# Patient Record
Sex: Female | Born: 1975 | Race: White | Hispanic: No | Marital: Single | Smoking: Never smoker
Health system: Southern US, Community
[De-identification: ages and names within clinical notes are randomized; demographics above are authoritative.]

## PROBLEM LIST (undated history)

## (undated) DIAGNOSIS — E079 Disorder of thyroid, unspecified: Secondary | ICD-10-CM

## (undated) DIAGNOSIS — T7840XA Allergy, unspecified, initial encounter: Secondary | ICD-10-CM

## (undated) DIAGNOSIS — M7989 Other specified soft tissue disorders: Secondary | ICD-10-CM

## (undated) DIAGNOSIS — K829 Disease of gallbladder, unspecified: Secondary | ICD-10-CM

## (undated) DIAGNOSIS — E559 Vitamin D deficiency, unspecified: Secondary | ICD-10-CM

## (undated) DIAGNOSIS — F419 Anxiety disorder, unspecified: Secondary | ICD-10-CM

## (undated) DIAGNOSIS — M549 Dorsalgia, unspecified: Secondary | ICD-10-CM

## (undated) DIAGNOSIS — F32A Depression, unspecified: Secondary | ICD-10-CM

## (undated) DIAGNOSIS — I10 Essential (primary) hypertension: Secondary | ICD-10-CM

## (undated) HISTORY — DX: Anxiety disorder, unspecified: F41.9

## (undated) HISTORY — DX: Disease of gallbladder, unspecified: K82.9

## (undated) HISTORY — PX: CHOLECYSTECTOMY: SHX55

## (undated) HISTORY — DX: Allergy, unspecified, initial encounter: T78.40XA

## (undated) HISTORY — DX: Vitamin D deficiency, unspecified: E55.9

## (undated) HISTORY — DX: Depression, unspecified: F32.A

## (undated) HISTORY — DX: Other specified soft tissue disorders: M79.89

## (undated) HISTORY — PX: TONSILLECTOMY: SUR1361

## (undated) HISTORY — PX: WISDOM TOOTH EXTRACTION: SHX21

## (undated) HISTORY — DX: Disorder of thyroid, unspecified: E07.9

## (undated) HISTORY — DX: Essential (primary) hypertension: I10

## (undated) HISTORY — DX: Dorsalgia, unspecified: M54.9

---

## 2017-05-21 ENCOUNTER — Encounter: Payer: Self-pay | Admitting: Physician Assistant

## 2017-05-21 ENCOUNTER — Ambulatory Visit (INDEPENDENT_AMBULATORY_CARE_PROVIDER_SITE_OTHER): Payer: BC Managed Care – PPO | Admitting: Physician Assistant

## 2017-05-21 VITALS — BP 132/74 | HR 69 | Temp 98.2°F | Ht 65.75 in | Wt 259.1 lb

## 2017-05-21 DIAGNOSIS — I10 Essential (primary) hypertension: Secondary | ICD-10-CM | POA: Insufficient documentation

## 2017-05-21 DIAGNOSIS — E079 Disorder of thyroid, unspecified: Secondary | ICD-10-CM

## 2017-05-21 DIAGNOSIS — Z7689 Persons encountering health services in other specified circumstances: Secondary | ICD-10-CM

## 2017-05-21 NOTE — Assessment & Plan Note (Signed)
Currently stable on 200 mcg levothyroxine. Will re-check labs in 1 month at physical.

## 2017-05-21 NOTE — Progress Notes (Signed)
Carolyn Harvey is a 41 y.o. female here to establish care.  History of Present Illness:   Chief Complaint  Patient presents with  . Establish Care    Acute Concerns: None  Chronic Issues: Hypothyroidism -- dx in high school, levels last checked about 6 months ago, has been pretty steady and been on 200 mcg for a bit; did see an endocrinologist for diagnosis (several years ago) but not since then, strong family hx of thyroid disease Hypertension -- dx within the last couple of years, has been on Cozaar 50 mg for a while (was on different medications prior to settling Cozaar), tolerating well  BP Readings from Last 3 Encounters:  05/21/17 132/74    Health Maintenance: Immunizations -- requesting records, has already had flu shot, given at work Colonoscopy -- no early colonoscopy Mammogram -- no early breast ultrasound PAP -- due soon, no abnormal PAP smears, last done on 06/12/16 Weight -- Weight: 259 lb 2 oz (117.5 kg)   No flowsheet data found.  No flowsheet data found.   Other providers/specialists: Eye -- [facility] Dentist -- Lucille Passy     Past Medical History:  Diagnosis Date  . Allergy   . Hypertension   . Thyroid disease      Social History   Social History  . Marital status: Significant Other    Spouse name: N/A  . Number of children: N/A  . Years of education: N/A   Occupational History  . Not on file.   Social History Main Topics  . Smoking status: Never Smoker  . Smokeless tobacco: Never Used  . Alcohol use Yes  . Drug use: No  . Sexual activity: Yes   Other Topics Concern  . Not on file   Social History Narrative   [employer]   Single with same partner   No children, 2 dogs    Past Surgical History:  Procedure Laterality Date  . CHOLECYSTECTOMY    . TONSILLECTOMY    . WISDOM TOOTH EXTRACTION      Family History  Problem Relation Age of Onset  . Asthma Mother   . Miscarriages /  [place] Mother   . Diabetes Father   . Hypertension Father   . Miscarriages / Stillbirths Sister   . Alzheimer's disease Paternal Grandmother   . Stroke Paternal Grandfather   . Breast cancer Neg Hx   . Colon cancer Neg Hx     No Known Allergies   Current Medications:   Current Outpatient Prescriptions:  .  levonorgestrel (MIRENA) 20 MCG/24HR IUD, 1 each by Intrauterine route once., Disp: , Rfl:  .  cetirizine (ZYRTEC) 10 MG tablet, Take 1 tablet by mouth daily., Disp: , Rfl:  .  levothyroxine (SYNTHROID, LEVOTHROID) 200 MCG tablet, Take 1 tablet by mouth daily., Disp: , Rfl:  .  losartan (COZAAR) 50 MG tablet, Take 1 tablet by mouth daily., Disp: , Rfl:    Review of Systems:   ROS  Negative unless otherwise specified in HPI.  Vitals:   Vitals:   05/21/17 0843  BP: 132/74  Pulse: 69  Temp: 98.2 F (36.8 C)  TempSrc: Oral  SpO2: 98%  Weight: 259 lb 2 oz (117.5 kg)  Height: 5' 5.75" (1.67 m)     Body mass index is 42.14 kg/m.  Physical Exam:   Physical Exam  Constitutional: She is oriented to person, place, and time. She appears well-developed and well-nourished.  HENT:  Head: Normocephalic and atraumatic.  Eyes: Conjunctivae and EOM are normal.  Neck: Normal range of motion. Neck supple.  Pulmonary/Chest: Effort normal.  Musculoskeletal: Normal range of motion.  Neurological: She is alert and oriented to person, place, and time.  Skin: Skin is warm and dry.  Psychiatric: She has a normal mood and affect. Her behavior is normal. Judgment and thought content normal.    Assessment and Plan:    Problem List Items Addressed This Visit      Cardiovascular and Mediastinum   Hypertension    Currently on Cozaar 50 mg, stable. Will check labs in 1 month at physical.      Relevant Medications   losartan (COZAAR) 50 MG tablet     Endocrine   Thyroid disease    Currently stable on 200 mcg levothyroxine. Will re-check labs in 1 month at physical.       Relevant Medications   levothyroxine (SYNTHROID, LEVOTHROID) 200 MCG tablet    Other Visit Diagnoses    Encounter to establish care    -  Primary     . Reviewed expectations re: course of current medical issues. . Discussed self-management of symptoms. . Outlined signs and symptoms indicating need for more acute intervention. . Patient verbalized understanding and all questions were answered. . See orders for this visit as documented in the electronic medical record. . Patient received an After-Visit Summary.   CMA or LPN served as scribe during this visit. History, Physical, and Plan performed by medical provider. Documentation and orders reviewed and attested to.   Inda Coke, PA-C

## 2017-05-21 NOTE — Assessment & Plan Note (Signed)
Currently on Cozaar 50 mg, stable. Will check labs in 1 month at physical.

## 2017-05-21 NOTE — Patient Instructions (Signed)
It was great to meet you!  Let's plan for a physical sometime after Nov 10th.

## 2017-06-17 ENCOUNTER — Encounter: Payer: BC Managed Care – PPO | Admitting: Physician Assistant

## 2017-06-18 ENCOUNTER — Telehealth: Payer: BC Managed Care – PPO | Admitting: Nurse Practitioner

## 2017-06-18 DIAGNOSIS — H9209 Otalgia, unspecified ear: Secondary | ICD-10-CM

## 2017-06-18 MED ORDER — FLUTICASONE PROPIONATE 50 MCG/ACT NA SUSP: 2.0000 | Freq: Every day | NASAL | 6 refills | Status: AC

## 2017-06-18 NOTE — Progress Notes (Signed)
We are sorry that you are not feeling well.  Here is how we plan to help!  Based on what you have shared with me it looks like you have sinusitis.  Sinusitis is inflammation and infection in the sinus cavities of the head.  Based on your presentation I believe you most likely have Acute Viral Sinusitis.This is an infection most likely caused by a virus. There is not specific treatment for viral sinusitis other than to help you with the symptoms until the infection runs its course.  You may use an oral decongestant such as Mucinex D or if you have glaucoma or high blood pressure use plain Mucinex. Saline nasal spray help and can safely be used as often as needed for congestion, I have prescribed: Fluticasone nasal spray two sprays in each nostril twice a day . This should also help open your ear up.  Providers prescribe antibiotics to treat infections caused by bacteria. Antibiotics are very powerful in treating bacterial infections when they are used properly. To maintain their effectiveness, they should be used only when necessary. Overuse of antibiotics has resulted in the development of superbugs that are resistant to treatment!    After careful review of your answers, I would not recommend an antibiotic for your condition.  Antibiotics are not effective against viruses and therefore should not be used to treat them. Common examples of infections caused by viruses include colds and flu    Some authorities believe that zinc sprays or the use of Echinacea may shorten the course of your symptoms.  Sinus infections are not as easily transmitted as other respiratory infection, however we still recommend that you avoid close contact with loved ones, especially the very young and elderly.  Remember to wash your hands thoroughly throughout the day as this is the number one way to prevent the spread of infection!  Home Care:  Only take medications as instructed by your medical team.  Complete the entire  course of an antibiotic.  Do not take these medications with alcohol.  A steam or ultrasonic humidifier can help congestion.  You can place a towel over your head and breathe in the steam from hot water coming from a faucet.  Avoid close contacts especially the very young and the elderly.  Cover your mouth when you cough or sneeze.  Always remember to wash your hands.  Get Help Right Away If:  You develop worsening fever or sinus pain.  You develop a severe head ache or visual changes.  Your symptoms persist after you have completed your treatment plan.  Make sure you  Understand these instructions.  Will watch your condition.  Will get help right away if you are not doing well or get worse.  Your e-visit answers were reviewed by a board certified advanced clinical practitioner to complete your personal care plan.  Depending on the condition, your plan could have included both over the counter or prescription medications.  If there is a problem please reply  once you have received a response from your provider.  Your safety is important to [place].  If you have drug allergies check your prescription carefully.    You can use MyChart to ask questions about today's visit, request a non-urgent call back, or ask for a work or school excuse for 24 hours related to this e-Visit. If it has been greater than 24 hours you will need to follow up with your provider, or enter a new e-Visit to address those concerns.  You will get an e-mail in the next two days asking about your experience.  I hope that your e-visit has been valuable and will speed your recovery. Thank you for using e-visits.

## 2017-06-29 ENCOUNTER — Ambulatory Visit (INDEPENDENT_AMBULATORY_CARE_PROVIDER_SITE_OTHER): Payer: BC Managed Care – PPO | Admitting: Family Medicine

## 2017-06-29 VITALS — BP 132/84 | HR 105 | Temp 98.4°F | Ht 65.75 in | Wt 260.2 lb

## 2017-06-29 DIAGNOSIS — I10 Essential (primary) hypertension: Secondary | ICD-10-CM

## 2017-06-29 DIAGNOSIS — H9201 Otalgia, right ear: Secondary | ICD-10-CM | POA: Diagnosis not present

## 2017-06-29 DIAGNOSIS — Z0000 Encounter for general adult medical examination without abnormal findings: Secondary | ICD-10-CM

## 2017-06-29 DIAGNOSIS — E039 Hypothyroidism, unspecified: Secondary | ICD-10-CM | POA: Diagnosis not present

## 2017-06-29 LAB — CBC WITH DIFFERENTIAL/PLATELET
Basophils Absolute: 0 10*3/uL (ref 0.0–0.1)
Basophils Relative: 0.5 % (ref 0.0–3.0)
Eosinophils Absolute: 0.3 10*3/uL (ref 0.0–0.7)
Eosinophils Relative: 2.9 % (ref 0.0–5.0)
HCT: 44.7 % (ref 36.0–46.0)
Hemoglobin: 14.8 g/dL (ref 12.0–15.0)
Lymphocytes Relative: 17.1 % (ref 12.0–46.0)
Lymphs Abs: 1.5 10*3/uL (ref 0.7–4.0)
MCHC: 33.1 g/dL (ref 30.0–36.0)
MCV: 93.5 fl (ref 78.0–100.0)
Monocytes Absolute: 0.9 10*3/uL (ref 0.1–1.0)
Monocytes Relative: 9.7 % (ref 3.0–12.0)
Neutro Abs: 6.3 10*3/uL (ref 1.4–7.7)
Neutrophils Relative %: 69.8 % (ref 43.0–77.0)
Platelets: 344 10*3/uL (ref 150.0–400.0)
RBC: 4.78 Mil/uL (ref 3.87–5.11)
RDW: 13.5 % (ref 11.5–15.5)
WBC: 9 10*3/uL (ref 4.0–10.5)

## 2017-06-29 LAB — COMPREHENSIVE METABOLIC PANEL
ALT: 17 U/L (ref 0–35)
AST: 15 U/L (ref 0–37)
Albumin: 3.9 g/dL (ref 3.5–5.2)
Alkaline Phosphatase: 56 U/L (ref 39–117)
BUN: 14 mg/dL (ref 6–23)
CO2: 30 mEq/L (ref 19–32)
Calcium: 9.3 mg/dL (ref 8.4–10.5)
Chloride: 102 mEq/L (ref 96–112)
Creatinine, Ser: 0.79 mg/dL (ref 0.40–1.20)
GFR: 84.85 mL/min (ref >60.00)
Glucose, Bld: 84 mg/dL (ref 70–99)
Potassium: 4.5 mEq/L (ref 3.5–5.1)
Sodium: 139 mEq/L (ref 135–145)
Total Bilirubin: 1.3 mg/dL — ABNORMAL HIGH (ref 0.2–1.2)
Total Protein: 6.5 g/dL (ref 6.0–8.3)

## 2017-06-29 LAB — HEMOGLOBIN A1C: Hgb A1c MFr Bld: 5.5 % (ref 4.6–6.5)

## 2017-06-29 LAB — T4, FREE: Free T4: 1.34 ng/dL (ref 0.60–1.60)

## 2017-06-29 LAB — TSH: TSH: 4.63 u[IU]/mL — ABNORMAL HIGH (ref 0.35–4.50)

## 2017-06-29 MED ORDER — MONTELUKAST SODIUM 10 MG PO TABS: 10.0000 mg | ORAL_TABLET | Freq: Every day | ORAL | 3 refills | Status: DC

## 2017-06-29 NOTE — Progress Notes (Signed)
Subjective:    Riko Lumsden is a 41 y.o. female and is here for a comprehensive physical exam.  Pertinent Gynecological History: No LMP recorded. Patient is not currently having periods (Reason: IUD).  Health Maintenance Due  Topic Date Due  . HIV Screening  07/19/1990  . TETANUS/TDAP  07/19/1994  . PAP SMEAR  07/19/1996   PMHx, SurgHx, SocialHx, Medications, and Allergies were reviewed in the Visit Navigator and updated as appropriate.   Past Medical History:  Diagnosis Date  . Allergy   . Hypertension   . Thyroid disease    Past Surgical History:  Procedure Laterality Date  . CHOLECYSTECTOMY    . TONSILLECTOMY    . WISDOM TOOTH EXTRACTION     Family History  Problem Relation Age of Onset  . Asthma Mother   . Miscarriages / [place] Mother   . Diabetes Father   . Hypertension Father   . Miscarriages / Stillbirths Sister   . Alzheimer's disease Paternal Grandmother   . Stroke Paternal Grandfather   . Breast cancer Neg Hx   . Colon cancer Neg Hx    Social History   Tobacco Use  . Smoking status: Never Smoker  . Smokeless tobacco: Never Used  Substance Use Topics  . Alcohol use: Yes  . Drug use: No   Review of Systems:   Pertinent items are noted in the HPI. Otherwise, ROS is negative.  Objective:   BP 132/84   Pulse (!) 105   Temp 98.4 F (36.9 C) (Oral)   Ht 5' 5.75" (1.67 m)   Wt 260 lb 3.2 oz (118 kg)   SpO2 96%   BMI 42.32 kg/m    Wt Readings from Last 3 Encounters:  06/29/17 260 lb 3.2 oz (118 kg)  05/21/17 259 lb 2 oz (117.5 kg)     Ht Readings from Last 3 Encounters:  06/29/17 5' 5.75" (1.67 m)  05/21/17 5' 5.75" (1.67 m)   General appearance: alert, cooperative and appears stated age. Head: normocephalic, without obvious abnormality, atraumatic. Neck: no adenopathy, supple, symmetrical, trachea midline; thyroid not enlarged, symmetric, no tenderness/mass/nodules. Lungs: clear to auscultation bilaterally. Heart: regular rate  and rhythm Abdomen: soft, non-tender; no masses,  no organomegaly. Extremities: extremities normal, atraumatic, no cyanosis or edema. Skin: skin color, texture, turgor normal, no rashes or lesions. Lymph: cervical, supraclavicular, and axillary nodes normal; no abnormal inguinal nodes palpated. Neurologic: grossly normal.  Assessment/Plan:   Diagnoses and all orders for this visit:  Routine physical examination -     CBC with Differential/Platelet -     Lipid panel; Future -     HIV antibody  Essential hypertension -     Comprehensive metabolic panel  Hypothyroidism, unspecified type -     T4, free -     TSH  Otalgia, right Comments: Ongoing. Concern for ETD. Continue flonase, zyrtec. Will add Singulair while sending to ENT.  Orders: -     montelukast (SINGULAIR) 10 MG tablet; Take 1 tablet (10 mg total) by mouth at bedtime. -     Ambulatory referral to ENT  Morbid obesity (Jud) -     Hemoglobin A1c    Patient Counseling:   [x]     Nutrition: Stressed importance of moderation in sodium/caffeine intake, saturated fat and cholesterol, caloric balance, sufficient intake of fresh fruits, vegetables, fiber, calcium, iron, and 1 mg of folate supplement per day (for females capable of pregnancy).   [x]      Stressed the importance of regular  exercise.    [x]     Substance Abuse: Discussed cessation/primary prevention of tobacco, alcohol, or other drug use; driving or other dangerous activities under the influence; availability of treatment for abuse.    [x]      Injury prevention: Discussed safety belts, safety helmets, smoke detector, smoking near bedding or upholstery.    [x]      Sexuality: Discussed sexually transmitted diseases, partner selection, use of condoms, avoidance of unintended pregnancy  and contraceptive alternatives.    [x]     Dental health: Discussed importance of regular tooth brushing, flossing, and dental visits.   [x]      Health maintenance and  immunizations reviewed. Please refer to Health maintenance section.   Briscoe Deutscher, DO [place]

## 2017-06-30 ENCOUNTER — Other Ambulatory Visit: Payer: Self-pay | Admitting: Physician Assistant

## 2017-06-30 ENCOUNTER — Encounter: Payer: Self-pay | Admitting: Family Medicine

## 2017-06-30 LAB — HIV ANTIBODY (ROUTINE TESTING W REFLEX): HIV 1&2 Ab, 4th Generation: NONREACTIVE

## 2017-06-30 MED ORDER — LOSARTAN POTASSIUM 50 MG PO TABS: 50.0000 mg | ORAL_TABLET | Freq: Every day | ORAL | 0 refills | Status: DC

## 2017-06-30 NOTE — Telephone Encounter (Signed)
Copied from [place] [id number]. Topic: Quick Communication - Rx Refill/Question >> Jun 30, 2017 12:49 PM Oliver Pila B wrote: Pt called to get her losartan refilled, pt was told to contact pharmacy when rx are needed

## 2017-07-05 ENCOUNTER — Telehealth: Payer: Self-pay | Admitting: Physician Assistant

## 2017-07-05 ENCOUNTER — Other Ambulatory Visit: Payer: Self-pay

## 2017-07-05 ENCOUNTER — Other Ambulatory Visit (INDEPENDENT_AMBULATORY_CARE_PROVIDER_SITE_OTHER): Payer: BC Managed Care – PPO

## 2017-07-05 DIAGNOSIS — E079 Disorder of thyroid, unspecified: Secondary | ICD-10-CM

## 2017-07-05 DIAGNOSIS — Z0000 Encounter for general adult medical examination without abnormal findings: Secondary | ICD-10-CM | POA: Diagnosis not present

## 2017-07-05 DIAGNOSIS — E039 Hypothyroidism, unspecified: Secondary | ICD-10-CM

## 2017-07-05 LAB — LIPID PANEL
Cholesterol: 145 mg/dL (ref 0–200)
HDL: 36.2 mg/dL — ABNORMAL LOW (ref >39.00)
LDL Cholesterol: 92 mg/dL (ref 0–99)
NonHDL: 108.6
Total CHOL/HDL Ratio: 4
Triglycerides: 85 mg/dL (ref 0.0–149.0)
VLDL: 17 mg/dL (ref 0.0–40.0)

## 2017-07-05 NOTE — Telephone Encounter (Signed)
Okay referral with G[facility]. She may not be available that soon. Dx hypothyroid - on high dose med.

## 2017-07-05 NOTE — Telephone Encounter (Signed)
Patient mentioned at check out a referral to Endocrinology. I did not see a referral in the system. Patient would prefer Carolyn Harvey if possible. Patient also mentioned getting in to see her prior to 08-11-17 if possible. Please advise.

## 2017-07-05 NOTE — Telephone Encounter (Signed)
Dr. Wallace, please see message and advise. 

## 2017-07-05 NOTE — Addendum Note (Signed)
Addended by: Marian Sorrow on: 07/05/2017 11:14 AM   Modules accepted: Orders

## 2017-07-05 NOTE — Telephone Encounter (Signed)
Spoke to pt, told her order for Referral for Endocrinology was done and someone will be contacting you to schedule an appt. Pt verbalized understanding.

## 2017-09-28 ENCOUNTER — Other Ambulatory Visit: Payer: Self-pay | Admitting: Physician Assistant

## 2017-09-28 MED ORDER — LOSARTAN POTASSIUM 50 MG PO TABS: 50.0000 mg | ORAL_TABLET | Freq: Every day | ORAL | 0 refills | Status: DC

## 2017-09-29 ENCOUNTER — Encounter: Payer: Self-pay | Admitting: Internal Medicine

## 2017-09-29 ENCOUNTER — Ambulatory Visit: Payer: BC Managed Care – PPO | Admitting: Internal Medicine

## 2017-09-29 VITALS — BP 124/76 | HR 78 | Ht 65.75 in | Wt 261.0 lb

## 2017-09-29 DIAGNOSIS — E039 Hypothyroidism, unspecified: Secondary | ICD-10-CM

## 2017-09-29 LAB — TSH: TSH: 7.05 u[IU]/mL — ABNORMAL HIGH (ref 0.35–4.50)

## 2017-09-29 LAB — T4, FREE: Free T4: 1.03 ng/dL (ref 0.60–1.60)

## 2017-09-29 NOTE — Patient Instructions (Signed)
Please continue levothyroxine 200 mcg daily.  Take the thyroid hormone every day, with water, at least 30 minutes before breakfast, separated by at least 4 hours from: - acid reflux medications - calcium - iron - multivitamins  Please come back for a follow-up appointment in 6 months.

## 2017-09-29 NOTE — Progress Notes (Signed)
Patient ID: Carolyn Harvey, female   DOB: 28-Feb-1976, 42 y.o.   MRN: [id number]    HPI  Carolyn Harvey is a 42 y.o.-year-old female, referred by her PCP, Inda Coke, PA , for management of hypothyroidism.  Pt. has been dx with hypothyroidism in early 1990s >> on Levothyroxine 200 mcg (last dose change 6-7 years). Prev. On Synthroid DAW.  She takes the thyroid hormone: - fasting - with water - separated by >30 min from b'fast  - no calcium, iron, PPIs - + multivitamins at night - no Biotin  I reviewed pt's thyroid tests: Lab Results  Component Value Date   TSH 4.63 (H) 06/29/2017   FREET4 1.34 06/29/2017    Pt denies: - weight gain - fatigue - cold intolerance - depression - constipation - dry skin - hair loss  Pt denies feeling nodules in neck, hoarseness, dysphagia/odynophagia, SOB with lying down.  She has + FH of thyroid disorders in: M - hyperthyroidism -  Had to have RAI tx; 2 sisters. + sister with RA. + FH of thyroid cancer in a first cousin.  No h/o radiation tx to head or neck. No recent use of iodine supplements. No recent steroids. No contrasted CT.  Pt. also has a history of HTN. On Losartan.   ROS: Constitutional: + see HPI Eyes: no blurry vision, no xerophthalmia ENT: no sore throat, no nodules palpated in throat, no dysphagia/odynophagia, no hoarseness Cardiovascular: no CP/SOB/palpitations/+ L leg swelling (chronic, prev. Investigated >> no pathology found) Respiratory: no cough/SOB Gastrointestinal: no N/V/D/C Musculoskeletal: no muscle/joint aches Skin: no rashes Neurological: no tremors/numbness/tingling/dizziness Psychiatric: no depression/anxiety  Past Medical History:  Diagnosis Date  . Allergy   . Hypertension   . Thyroid disease    Past Surgical History:  Procedure Laterality Date  . CHOLECYSTECTOMY    . TONSILLECTOMY    . WISDOM TOOTH EXTRACTION     Social History   Socioeconomic History  . Marital status: Single   Spouse name: Not on file  . Number of children: 0  Occupational History  . librarian  Tobacco Use  . Smoking status: Never Smoker  . Smokeless tobacco: Never Used  Substance and Sexual Activity  . Alcohol use: Yes, beer, 1-2x a week  . Drug use: No  . Sexual activity: Yes  Other Topics Concern  . Not on file  Social History Narrative   [employer]   Single with same partner   No children, 2 dogs   Current Outpatient Medications on File Prior to Visit  Medication Sig Dispense Refill  . cetirizine (ZYRTEC) 10 MG tablet Take 1 tablet by mouth daily.    . fluticasone (FLONASE) 50 MCG/ACT nasal spray Place 2 sprays daily into both nostrils. 16 g 6  . levonorgestrel (MIRENA) 20 MCG/24HR IUD 1 each by Intrauterine route once.    Marland Kitchen levothyroxine (SYNTHROID, LEVOTHROID) 200 MCG tablet Take 1 tablet by mouth daily.    Marland Kitchen losartan (COZAAR) 50 MG tablet Take 1 tablet (50 mg total) by mouth daily. 90 tablet 0  . montelukast (SINGULAIR) 10 MG tablet Take 1 tablet (10 mg total) by mouth at bedtime. (Patient not taking: Reported on 09/29/2017) 30 tablet 3   No current facility-administered medications on file prior to visit.    No Known Allergies Family History  Problem Relation Age of Onset  . Asthma Mother   . Miscarriages / [place] Mother   . Hypothyroidism Mother   . Diabetes Father   . Hypertension Father   .  Miscarriages / Stillbirths Sister   . Fibromyalgia Sister   . Hypothyroidism Sister   . CVA Maternal Grandfather   . Alzheimer's disease Paternal Grandmother   . Stroke Paternal Grandfather   . Breast cancer Neg Hx   . Colon cancer Neg Hx     PE: BP 124/76   Pulse 78   Ht 5' 5.75" (1.67 m)   Wt 261 lb (118.4 kg)   BMI 42.45 kg/m  Wt Readings from Last 3 Encounters:  09/29/17 261 lb (118.4 kg)  06/29/17 260 lb 3.2 oz (118 kg)  05/21/17 259 lb 2 oz (117.5 kg)   Constitutional: obese, in NAD Eyes: PERRLA, EOMI, no exophthalmos ENT: moist mucous  membranes, no thyromegaly, no cervical lymphadenopathy Cardiovascular: RRR, No MRG Respiratory: CTA B Gastrointestinal: abdomen soft, NT, ND, BS+ Musculoskeletal: no deformities, strength intact in all 4 Skin: moist, warm, no rashes Neurological: no tremor with outstretched hands, DTR normal in all 4  ASSESSMENT: 1. Hypothyroidism  PLAN:  1. Patient with long-standing hypothyroidism (dx'ed in high school), on high dose levothyroxine therapy. Last TSH from 3 mo ago was slightly high. Dose of LT4 was not changed then, in fact, it has not been changed in several years). - she appears euthyroid and has no complaints at this visit - she does not appear to have a goiter, thyroid nodules, or neck compression symptoms - We discussed about correct intake of levothyroxine, fasting, with water, separated by at least 30 minutes from breakfast, and separated by more than 4 hours from calcium, iron, multivitamins, acid reflux medications (PPIs). - will check thyroid tests today: TSH, free T4 and will also add TPO and ATA Abs to check for Hashimoto's thyroiditis - she does have an extensive FH of thyroid ds and also has an autoimmune ds (RA) in sister) - we discussed that Hashimoto's thyroiditis (in case she does have this) is an autoimmune condition, the most common cause of hypothyroidism and [place], and, while selenium can help lowering the antibodies, it does not work all patients.  Mostly, the treatment is limited to levothyroxine and lifestyle changes to  improve the immune system. - If labs today are abnormal, she will need to return in ~6 weeks for repeat labs - Otherwise, I will see her back in 6 months  Component     Latest Ref Rng & Units 09/29/2017  T4,Free(Direct)     0.60 - 1.60 ng/dL 1.03  TSH     0.35 - 4.50 uIU/mL 7.05 (H)  Thyroglobulin Ab     < or = 1 IU/mL 190 (H)  Thyroperoxidase Ab SerPl-aCnc     <9 IU/mL 6   New dx of Hashimoto's thyroiditis. TSH above target >> increase LT4 to  225 mcg daily and repeat TFTs in 6 weeks.  Philemon Kingdom, MD PhD [facility] Endocrinology

## 2017-09-30 ENCOUNTER — Other Ambulatory Visit: Payer: Self-pay | Admitting: Internal Medicine

## 2017-09-30 LAB — THYROGLOBULIN ANTIBODY: THYROGLOBULIN AB: 190 [IU]/mL — AB (ref <=1)

## 2017-09-30 LAB — THYROID PEROXIDASE ANTIBODY: THYROID PEROXIDASE ANTIBODY: 6 [IU]/mL (ref <9)

## 2017-09-30 MED ORDER — LEVOTHYROXINE SODIUM 25 MCG PO TABS: 25.0000 ug | ORAL_TABLET | Freq: Every day | ORAL | 5 refills | Status: DC

## 2018-03-10 ENCOUNTER — Other Ambulatory Visit: Payer: Self-pay

## 2018-03-10 MED ORDER — LEVOTHYROXINE SODIUM 200 MCG PO TABS: 200.0000 ug | ORAL_TABLET | Freq: Every day | ORAL | 1 refills | Status: DC

## 2018-03-10 MED ORDER — LEVOTHYROXINE SODIUM 25 MCG PO TABS: 25.0000 ug | ORAL_TABLET | Freq: Every day | ORAL | 1 refills | Status: DC

## 2018-03-29 ENCOUNTER — Other Ambulatory Visit: Payer: Self-pay | Admitting: Family Medicine

## 2018-03-29 MED ORDER — LOSARTAN POTASSIUM 50 MG PO TABS: 50.0000 mg | ORAL_TABLET | Freq: Every day | ORAL | 0 refills | Status: DC

## 2018-03-31 ENCOUNTER — Ambulatory Visit: Payer: BC Managed Care – PPO | Admitting: Internal Medicine

## 2018-06-17 ENCOUNTER — Encounter: Payer: Self-pay | Admitting: Internal Medicine

## 2018-06-17 ENCOUNTER — Ambulatory Visit: Payer: BC Managed Care – PPO | Admitting: Internal Medicine

## 2018-06-17 VITALS — BP 120/78 | HR 68 | Ht 65.75 in | Wt 265.0 lb

## 2018-06-17 DIAGNOSIS — E063 Autoimmune thyroiditis: Secondary | ICD-10-CM | POA: Diagnosis not present

## 2018-06-17 DIAGNOSIS — E039 Hypothyroidism, unspecified: Secondary | ICD-10-CM

## 2018-06-17 DIAGNOSIS — E038 Other specified hypothyroidism: Secondary | ICD-10-CM

## 2018-06-17 LAB — TSH: TSH: 0.93 u[IU]/mL (ref 0.35–4.50)

## 2018-06-17 LAB — T4, FREE: Free T4: 1.23 ng/dL (ref 0.60–1.60)

## 2018-06-17 NOTE — Progress Notes (Signed)
Patient ID: Carolyn Harvey, female   DOB: 1975-11-06, 42 y.o.   MRN: [id number]    HPI  Carolyn Harvey is a 42 y.o.-year-old female, returning for f/u for hypothyroidism. Last OV 09/2017.  Reviewed and addended hx: Pt. has been dx with hypothyroidism in early 1990s >> on Levothyroxine 200 mcg. Prev. On Synthroid DAW.  At last OV, we increased her LT4 dose to 225 mcg daily.  She did not return for repeat labs as she thought she needed an office visit for this.  Pt takes LT4: - in am - fasting - at least 30 min from b'fast - no Ca, Fe, PPIs - + MVIs at night - not on Biotin  Reviewed her TFTs: Lab Results  Component Value Date   TSH 7.05 (H) 09/29/2017   TSH 4.63 (H) 06/29/2017   FREET4 1.03 09/29/2017   FREET4 1.34 06/29/2017    Thyroid Ab's were elevated at last OV: Component     Latest Ref Rng & Units 09/29/2017  Thyroglobulin Ab     < or = 1 IU/mL 190 (H)  Thyroperoxidase Ab SerPl-aCnc     <9 IU/mL 6   Pt denies: - feeling nodules in neck - hoarseness - dysphagia - choking - SOB with lying down  She has + FH of thyroid disorders in: M - hyperthyroidism -  Had to have RAI tx; 2 sisters. + sister with RA. + FH of thyroid cancer in a first cousin.  No h/o radiation tx to head or neck. No herbal supplements. No Biotin use. No recent steroids use.   Pt. also has a history of HTN. On Losartan.   She started Singulair.   ROS: Constitutional: + weight gain/no weight loss, no fatigue, no subjective hyperthermia, no subjective hypothermia Eyes: no blurry vision, no xerophthalmia ENT: no sore throat, + see HPI Cardiovascular: no CP/no SOB/no palpitations/+L leg swelling (chronic) Respiratory: no cough/no SOB/no wheezing Gastrointestinal: no N/no V/no D/no C/no acid reflux Musculoskeletal: no muscle aches/no joint aches Skin: no rashes, no hair loss Neurological: no tremors/no numbness/no tingling/no dizziness  I reviewed pt's medications, allergies, PMH, social hx,  family hx, and changes were documented in the history of present illness. Otherwise, unchanged from my initial visit note.  Past Medical History:  Diagnosis Date  . Allergy   . Hypertension   . Thyroid disease    Past Surgical History:  Procedure Laterality Date  . CHOLECYSTECTOMY    . TONSILLECTOMY    . WISDOM TOOTH EXTRACTION     Social History   Socioeconomic History  . Marital status: Single    Spouse name: Not on file  . Number of children: 0  Occupational History  . librarian  Tobacco Use  . Smoking status: Never Smoker  . Smokeless tobacco: Never Used  Substance and Sexual Activity  . Alcohol use: Yes, beer, 1-2x a week  . Drug use: No  . Sexual activity: Yes  Other Topics Concern  . Not on file  Social History Narrative   [facility]   Single with same partner   No children, 2 dogs   Current Outpatient Medications on File Prior to Visit  Medication Sig Dispense Refill  . cetirizine (ZYRTEC) 10 MG tablet Take 1 tablet by mouth daily.    . fluticasone (FLONASE) 50 MCG/ACT nasal spray Place 2 sprays daily into both nostrils. 16 g 6  . levonorgestrel (MIRENA) 20 MCG/24HR IUD 1 each by Intrauterine route once.    Marland Kitchen levothyroxine (SYNTHROID, LEVOTHROID)  200 MCG tablet Take 1 tablet (200 mcg total) by mouth daily. 90 tablet 1  . levothyroxine (SYNTHROID, LEVOTHROID) 25 MCG tablet Take 1 tablet (25 mcg total) by mouth daily before breakfast. 90 tablet 1  . losartan (COZAAR) 50 MG tablet Take 1 tablet (50 mg total) by mouth daily. 90 tablet 0  . montelukast (SINGULAIR) 10 MG tablet Take 1 tablet (10 mg total) by mouth at bedtime. (Patient not taking: Reported on 09/29/2017) 30 tablet 3   No current facility-administered medications on file prior to visit.    No Known Allergies Family History  Problem Relation Age of Onset  . Asthma Mother   . Miscarriages / [place] Mother   . Hypothyroidism Mother   . Diabetes Father   . Hypertension Father   .  Miscarriages / Stillbirths Sister   . Fibromyalgia Sister   . Hypothyroidism Sister   . CVA Maternal Grandfather   . Alzheimer's disease Paternal Grandmother   . Stroke Paternal Grandfather   . Breast cancer Neg Hx   . Colon cancer Neg Hx     PE: BP 120/78   Pulse 68   Ht 5' 5.75" (1.67 m) Comment: measured  Wt 265 lb (120.2 kg)   SpO2 97%   BMI 43.10 kg/m  Wt Readings from Last 3 Encounters:  06/17/18 265 lb (120.2 kg)  09/29/17 261 lb (118.4 kg)  06/29/17 260 lb 3.2 oz (118 kg)   Constitutional: overweight, in NAD Eyes: PERRLA, EOMI, no exophthalmos ENT: moist mucous membranes, no thyromegaly, no cervical lymphadenopathy Cardiovascular: RRR, No MRG, + L leg swelling (chronic) Respiratory: CTA B Gastrointestinal: abdomen soft, NT, ND, BS+ Musculoskeletal: no deformities, strength intact in all 4 Skin: moist, warm, no rashes Neurological: no tremor with outstretched hands, DTR normal in all 4  ASSESSMENT: 1. Hypothyroidism  2. Hashimoto's thyroiditis  PLAN:  1. Patient with long-standing hypothyroidism diagnosed in high school, on high-dose levothyroxine therapy.  At last visit, TSH was still high, so increased her levothyroxine dose. - latest thyroid labs reviewed with pt >> high, but this was before the increase in levothyroxine dose.  She did not return for labs afterwards - she continues on LT4 225 mcg daily - pt feels good on this dose. - we discussed about taking the thyroid hormone every day, with water, >30 minutes before breakfast, separated by >4 hours from acid reflux medications, calcium, iron, multivitamins. Pt. is taking it correctly. - will check thyroid tests today: TSH and fT4 - If labs are abnormal, she will need to return for repeat TFTs in 1.5 months - Otherwise, I will see her back in 1 year  2. Hashimoto's thyroiditis - new dx - we discussed about her Hashimoto thyroiditis diagnosis. I explained that this is an autoimmune disorder, in which she  develops antibodies against her own thyroid. The antibodies bind to the thyroid tissue and cause inflammation, and, eventually, destruction of the gland and hypothyroidism.  I also explained that thyroid enlargement especially at the beginning of her Hashimoto thyroiditis course is not uncommon, and it has a waxing and waning character.  - We discussed about treatment for Hashimoto thyroiditis, which is actually limited to thyroid hormones in case her TFTs are abnormal. Supplements like selenium has been tried with various results, some showing improvement in the TPO antibodies. However, there are no randomized controlled trials of this are consistent results between trials. We also discussed about ways to improve her immune system (relaxation, diet, exercise, sleep) to reduce the  Ab titer and, subsequently, the thyroid inflammation. - We decided to try selenium 200 mcg daily and I also recommended to stop dairy products to reduce GI inflammation which is correlated to immunogenicity  Office Visit on 06/17/2018  Component Date Value Ref Range Status  . TSH 06/17/2018 0.93  0.35 - 4.50 uIU/mL Final  . Free T4 06/17/2018 1.23  0.60 - 1.60 ng/dL Final   Comment: Specimens from patients who are undergoing biotin therapy and /or ingesting biotin supplements may contain high levels of biotin.  The higher biotin concentration in these specimens interferes with this Free T4 assay.  Specimens that contain high levels  of biotin may cause false high results for this Free T4 assay.  Please interpret results in light of the total clinical presentation of the patient.     Thyroid tests are excellent.  We will continue the current dose of levothyroxine.  Philemon Kingdom, MD PhD [facility]

## 2018-06-17 NOTE — Patient Instructions (Signed)
Please stop at the lab.  Try selenium 200 mcg daily.  Please continue Levothyroxine 225 mcg daily.  Take the thyroid hormone every day, with water, at least 30 minutes before breakfast, separated by at least 4 hours from: - acid reflux medications - calcium - iron - multivitamins  Please come back for a follow-up appointment in 1 year.

## 2018-06-28 ENCOUNTER — Other Ambulatory Visit: Payer: Self-pay | Admitting: Family Medicine

## 2018-08-24 ENCOUNTER — Encounter: Payer: Self-pay | Admitting: Physician Assistant

## 2018-08-24 ENCOUNTER — Ambulatory Visit (INDEPENDENT_AMBULATORY_CARE_PROVIDER_SITE_OTHER): Payer: BC Managed Care – PPO | Admitting: Physician Assistant

## 2018-08-24 VITALS — BP 110/80 | HR 65 | Temp 98.6°F | Ht 65.75 in | Wt 262.4 lb

## 2018-08-24 DIAGNOSIS — Z0000 Encounter for general adult medical examination without abnormal findings: Secondary | ICD-10-CM | POA: Diagnosis not present

## 2018-08-24 DIAGNOSIS — Z13220 Encounter for screening for lipoid disorders: Secondary | ICD-10-CM | POA: Diagnosis not present

## 2018-08-24 DIAGNOSIS — Z136 Encounter for screening for cardiovascular disorders: Secondary | ICD-10-CM

## 2018-08-24 DIAGNOSIS — Z6841 Body Mass Index (BMI) 40.0 and over, adult: Secondary | ICD-10-CM

## 2018-08-24 DIAGNOSIS — E039 Hypothyroidism, unspecified: Secondary | ICD-10-CM

## 2018-08-24 DIAGNOSIS — Z23 Encounter for immunization: Secondary | ICD-10-CM | POA: Diagnosis not present

## 2018-08-24 DIAGNOSIS — I10 Essential (primary) hypertension: Secondary | ICD-10-CM

## 2018-08-24 NOTE — Patient Instructions (Signed)
It was great to see you!  Please go to the lab for blood work.   Our office will call you with your results unless you have chosen to receive results via MyChart.  If your blood work is normal we will follow-up each year for physicals and as scheduled for chronic medical problems.  If anything is abnormal we will treat accordingly and get you in for a follow-up.  Take care,  [facility] Maintenance, Female Adopting a healthy lifestyle and getting preventive care can go a long way to promote health and wellness. Talk with your health care provider about what schedule of regular examinations is right for you. This is a good chance for you to check in with your provider about disease prevention and staying healthy. In between checkups, there are plenty of things you can do on your own. Experts have done a lot of research about which lifestyle changes and preventive measures are most likely to keep you healthy. Ask your health care provider for more information. Weight and diet Eat a healthy diet  Be sure to include plenty of vegetables, fruits, low-fat dairy products, and lean protein.  Do not eat a lot of foods high in solid fats, added sugars, or salt.  Get regular exercise. This is one of the most important things you can do for your health. ? Most adults should exercise for at least 150 minutes each week. The exercise should increase your heart rate and make you sweat (moderate-intensity exercise). ? Most adults should also do strengthening exercises at least twice a week. This is in addition to the moderate-intensity exercise. Maintain a healthy weight  Body mass index (BMI) is a measurement that can be used to identify possible weight problems. It estimates body fat based on height and weight. Your health care provider can help determine your BMI and help you achieve or maintain a healthy weight.  For females 55 years of age and older: ? A BMI below 18.5 is considered  underweight. ? A BMI of 18.5 to 24.9 is normal. ? A BMI of 25 to 29.9 is considered overweight. ? A BMI of 30 and above is considered obese. Watch levels of cholesterol and blood lipids  You should start having your blood tested for lipids and cholesterol at 44 years of age, then have this test every 5 years.  You may need to have your cholesterol levels checked more often if: ? Your lipid or cholesterol levels are high. ? You are older than 44 years of age. ? You are at high risk for heart disease. Cancer screening Lung Cancer  Lung cancer screening is recommended for adults 60-73 years old who are at high risk for lung cancer because of a history of smoking.  A yearly low-dose CT scan of the lungs is recommended for people who: ? Currently smoke. ? Have quit within the past 15 years. ? Have at least a 30-pack-year history of smoking. A pack year is smoking an average of one pack of cigarettes a day for 1 year.  Yearly screening should continue until it has been 15 years since you quit.  Yearly screening should stop if you develop a health problem that would prevent you from having lung cancer treatment. Breast Cancer  Practice breast self-awareness. This means understanding how your breasts normally appear and feel.  It also means doing regular breast self-exams. Let your health care provider know about any changes, no matter how small.  If you are in  your 20s or 30s, you should have a clinical breast exam (CBE) by a health care provider every 1-3 years as part of a regular health exam.  If you are 40 or older, have a CBE every year. Also consider having a breast X-ray (mammogram) every year.  If you have a family history of breast cancer, talk to your health care provider about genetic screening.  If you are at high risk for breast cancer, talk to your health care provider about having an MRI and a mammogram every year.  Breast cancer gene (BRCA) assessment is recommended  for women who have family members with BRCA-related cancers. BRCA-related cancers include: ? Breast. ? Ovarian. ? Tubal. ? Peritoneal cancers.  Results of the assessment will determine the need for genetic counseling and BRCA1 and BRCA2 testing. Cervical Cancer Your health care provider may recommend that you be screened regularly for cancer of the pelvic organs (ovaries, uterus, and vagina). This screening involves a pelvic examination, including checking for microscopic changes to the surface of your cervix (Pap test). You may be encouraged to have this screening done every 3 years, beginning at age 21.  For women ages 30-65, health care providers may recommend pelvic exams and Pap testing every 3 years, or they may recommend the Pap and pelvic exam, combined with testing for human papilloma virus (HPV), every 5 years. Some types of HPV increase your risk of cervical cancer. Testing for HPV may also be done on women of any age with unclear Pap test results.  Other health care providers may not recommend any screening for nonpregnant women who are considered low risk for pelvic cancer and who do not have symptoms. Ask your health care provider if a screening pelvic exam is right for you.  If you have had past treatment for cervical cancer or a condition that could lead to cancer, you need Pap tests and screening for cancer for at least 20 years after your treatment. If Pap tests have been discontinued, your risk factors (such as having a new sexual partner) need to be reassessed to determine if screening should resume. Some women have medical problems that increase the chance of getting cervical cancer. In these cases, your health care provider may recommend more frequent screening and Pap tests. Colorectal Cancer  This type of cancer can be detected and often prevented.  Routine colorectal cancer screening usually begins at 44 years of age and continues through 44 years of age.  Your health  care provider may recommend screening at an earlier age if you have risk factors for colon cancer.  Your health care provider may also recommend using home test kits to check for hidden blood in the stool.  A small camera at the end of a tube can be used to examine your colon directly (sigmoidoscopy or colonoscopy). This is done to check for the earliest forms of colorectal cancer.  Routine screening usually begins at age 50.  Direct examination of the colon should be repeated every 5-10 years through 44 years of age. However, you may need to be screened more often if early forms of precancerous polyps or small growths are found. Skin Cancer  Check your skin from head to toe regularly.  Tell your health care provider about any new moles or changes in moles, especially if there is a change in a mole's shape or color.  Also tell your health care provider if you have a mole that is larger than the size of a pencil   eraser.  Always use sunscreen. Apply sunscreen liberally and repeatedly throughout the day.  Protect yourself by wearing long sleeves, pants, a wide-brimmed hat, and sunglasses whenever you are outside. Heart disease, diabetes, and high blood pressure  High blood pressure causes heart disease and increases the risk of stroke. High blood pressure is more likely to develop in: ? People who have blood pressure in the high end of the normal range (130-139/85-89 mm Hg). ? People who are overweight or obese. ? People who are African American.  If you are 68-37 years of age, have your blood pressure checked every 3-5 years. If you are 48 years of age or older, have your blood pressure checked every year. You should have your blood pressure measured twice-once when you are at a hospital or clinic, and once when you are not at a hospital or clinic. Record the average of the two measurements. To check your blood pressure when you are not at a hospital or clinic, you can use: ? An automated  blood pressure machine at a pharmacy. ? A home blood pressure monitor.  If you are between 92 years and 57 years old, ask your health care provider if you should take aspirin to prevent strokes.  Have regular diabetes screenings. This involves taking a blood sample to check your fasting blood sugar level. ? If you are at a normal weight and have a low risk for diabetes, have this test once every three years after 44 years of age. ? If you are overweight and have a high risk for diabetes, consider being tested at a younger age or more often. Preventing infection Hepatitis B  If you have a higher risk for hepatitis B, you should be screened for this virus. You are considered at high risk for hepatitis B if: ? You were born in a country where hepatitis B is common. Ask your health care provider which countries are considered high risk. ? Your parents were born in a high-risk country, and you have not been immunized against hepatitis B (hepatitis B vaccine). ? You have HIV or AIDS. ? You use needles to inject street drugs. ? You live with someone who has hepatitis B. ? You have had sex with someone who has hepatitis B. ? You get hemodialysis treatment. ? You take certain medicines for conditions, including cancer, organ transplantation, and autoimmune conditions. Hepatitis C  Blood testing is recommended for: ? Everyone born from [birth year] through 1966. ? Anyone with known risk factors for hepatitis C. Sexually transmitted infections (STIs)  You should be screened for sexually transmitted infections (STIs) including gonorrhea and chlamydia if: ? You are sexually active and are younger than 44 years of age. ? You are older than 44 years of age and your health care provider tells you that you are at risk for this type of infection. ? Your sexual activity has changed since you were last screened and you are at an increased risk for chlamydia or gonorrhea. Ask your health care provider if you are at  risk.  If you do not have HIV, but are at risk, it may be recommended that you take a prescription medicine daily to prevent HIV infection. This is called pre-exposure prophylaxis (PrEP). You are considered at risk if: ? You are sexually active and do not regularly use condoms or know the HIV status of your partner(s). ? You take drugs by injection. ? You are sexually active with a partner who has HIV. Talk with your health  care provider about whether you are at high risk of being infected with HIV. If you choose to begin PrEP, you should first be tested for HIV. You should then be tested every 3 months for as long as you are taking PrEP. Pregnancy  If you are premenopausal and you may become pregnant, ask your health care provider about preconception counseling.  If you may become pregnant, take 400 to 800 micrograms (mcg) of folic acid every day.  If you want to prevent pregnancy, talk to your health care provider about birth control (contraception). Osteoporosis and menopause  Osteoporosis is a disease in which the bones lose minerals and strength with aging. This can result in serious bone fractures. Your risk for osteoporosis can be identified using a bone density scan.  If you are 65 years of age or older, or if you are at risk for osteoporosis and fractures, ask your health care provider if you should be screened.  Ask your health care provider whether you should take a calcium or vitamin D supplement to lower your risk for osteoporosis.  Menopause may have certain physical symptoms and risks.  Hormone replacement therapy may reduce some of these symptoms and risks. Talk to your health care provider about whether hormone replacement therapy is right for you. Follow these instructions at home:  Schedule regular health, dental, and eye exams.  Stay current with your immunizations.  Do not use any tobacco products including cigarettes, chewing tobacco, or electronic  cigarettes.  If you are pregnant, do not drink alcohol.  If you are breastfeeding, limit how much and how often you drink alcohol.  Limit alcohol intake to no more than 1 drink per day for nonpregnant women. One drink equals 12 ounces of beer, 5 ounces of wine, or 1 ounces of hard liquor.  Do not use street drugs.  Do not share needles.  Ask your health care provider for help if you need support or information about quitting drugs.  Tell your health care provider if you often feel depressed.  Tell your health care provider if you have ever been abused or do not feel safe at home. This information is not intended to replace advice given to you by your health care provider. Make sure you discuss any questions you have with your health care provider. Document Released: 02/02/2011 Document Revised: 12/26/2015 Document Reviewed: 04/23/2015 [employer] Interactive Patient Education  2019 [employer]  

## 2018-08-24 NOTE — Progress Notes (Signed)
I acted as a Education administrator for [employer], PA-C Anselmo Pickler, LPN   Subjective:    Carolyn Harvey is a 43 y.o. female and is here for a comprehensive physical exam.   HPI  There are no preventive care reminders to display for this patient.  Acute Concerns: None  Chronic Issues: Hypothyroidism -- currently seeing Dr. Cruzita Lederer for this. Currently on 225 mcg levothyroxine. Doing well on this regimen. Obesity -- currently working on more plant based diet. Walks dog daily. HTN -- Currently taking Losartan 50 mg. At home blood pressure readings are: not recorded. Patient denies chest pain, SOB, blurred vision, dizziness, unusual headaches, lower leg swelling. Patient is compliant with medication. Denies excessive caffeine intake, stimulant usage, excessive alcohol intake, or increase in salt consumption.  Health Maintenance: Immunizations -- TDap today Colonoscopy -- n/a Mammogram -- overdue, will schedule PAP -- UTD Diet -- trying to adopt a more plant-based diet Sleep habits -- good sleeper Exercise -- walking Weight -- Weight: 262 lb 6.1 oz (119 kg)  Mood -- nothing significant, does see therapist prn Weight history: Wt Readings from Last 10 Encounters:  08/24/18 262 lb 6.1 oz (119 kg)  06/17/18 265 lb (120.2 kg)  09/29/17 261 lb (118.4 kg)  06/29/17 260 lb 3.2 oz (118 kg)  05/21/17 259 lb 2 oz (117.5 kg)   No LMP recorded. (Menstrual status: IUD). Alcohol use: occasional Tobacco use: never  Depression screen PHQ 2/9 08/24/2018  Decreased Interest 0  Down, Depressed, Hopeless 0  PHQ - 2 Score 0   Other providers/specialists: Patient Care Team: Inda Coke, [place] as PCP - General (Physician Assistant)    PMHx, SurgHx, SocialHx, Medications, and Allergies were reviewed in the Visit Navigator and updated as appropriate.   Past Medical History:  Diagnosis Date  . Allergy   . Hypertension   . Thyroid disease      Past Surgical History:  Procedure Laterality  Date  . CHOLECYSTECTOMY    . TONSILLECTOMY    . WISDOM TOOTH EXTRACTION       Family History  Problem Relation Age of Onset  . Asthma Mother   . Miscarriages / [place] Mother   . Hypothyroidism Mother   . Diabetes Father   . Hypertension Father   . Pulmonary embolism Father   . Miscarriages / Stillbirths Sister   . Fibromyalgia Sister   . Hypothyroidism Sister   . CVA Maternal Grandfather   . Alzheimer's disease Paternal Grandmother   . Stroke Paternal Grandfather   . Stomach cancer Paternal Uncle   . Breast cancer Neg Hx   . Colon cancer Neg Hx     Social History   Tobacco Use  . Smoking status: Never Smoker  . Smokeless tobacco: Never Used  Substance Use Topics  . Alcohol use: Yes  . Drug use: No    Review of Systems:   Review of Systems  Constitutional: Negative.  Negative for chills, fever, malaise/fatigue and weight loss.  HENT: Negative.  Negative for hearing loss, sinus pain and sore throat.   Eyes: Negative.  Negative for blurred vision.  Respiratory: Negative.  Negative for cough and shortness of breath.   Cardiovascular: Negative.  Negative for chest pain, palpitations and leg swelling.  Gastrointestinal: Negative.  Negative for abdominal pain, constipation, diarrhea, heartburn, nausea and vomiting.  Genitourinary: Negative.  Negative for dysuria, frequency and urgency.  Musculoskeletal: Negative.  Negative for back pain, myalgias and neck pain.  Skin: Negative.  Negative for itching and  rash.  Neurological: Negative.  Negative for dizziness, tingling, seizures, loss of consciousness and headaches.  Endo/Heme/Allergies: Negative.  Negative for polydipsia.  Psychiatric/Behavioral: Negative.  Negative for depression. The patient is not nervous/anxious.     Objective:   BP 110/80 (BP Location: Left Arm, Patient Position: Sitting, Cuff Size: Large)   Pulse 65   Temp 98.6 F (37 C) (Oral)   Ht 5' 5.75" (1.67 m)   Wt 262 lb 6.1 oz (119 kg)   SpO2  96%   BMI 42.67 kg/m  Body mass index is 42.67 kg/m.   General Appearance:    Alert, cooperative, no distress, appears stated age  Head:    Normocephalic, without obvious abnormality, atraumatic  Eyes:    PERRL, conjunctiva/corneas clear, EOM's intact, fundi    benign, both eyes  Ears:    Normal TM's and external ear canals, both ears  Nose:   Nares normal, septum midline, mucosa normal, no drainage    or sinus tenderness  Throat:   Lips, mucosa, and tongue normal; teeth and gums normal  Neck:   Supple, symmetrical, trachea midline, no adenopathy;    thyroid:  no enlargement/tenderness/nodules; no carotid   bruit or JVD  Back:     Symmetric, no curvature, ROM normal, no CVA tenderness  Lungs:     Clear to auscultation bilaterally, respirations unlabored  Chest Wall:    No tenderness or deformity   Heart:    Regular rate and rhythm, S1 and S2 normal, no murmur, rub or gallop  Breast Exam:    deferred  Abdomen:     Soft, non-tender, bowel sounds active all four quadrants,    no masses, no organomegaly  Genitalia:    Normal female without lesion, discharge or tenderness  Extremities:   Extremities normal, atraumatic, no cyanosis or edema  Pulses:   2+ and symmetric all extremities  Skin:   Skin color, texture, turgor normal, no rashes or lesions  Lymph nodes:   Cervical, supraclavicular, and axillary nodes normal  Neurologic:   CNII-XII intact, normal strength, sensation and reflexes    throughout    Assessment/Plan:   Carolyn Harvey was seen today for annual exam.  Diagnoses and all orders for this visit:  Routine physical examination Today patient counseled on age appropriate routine health concerns for screening and prevention, each reviewed and up to date or declined. Immunizations reviewed and up to date or declined. Labs ordered and reviewed. Risk factors for depression reviewed and negative. Hearing function and visual acuity are intact. ADLs screened and addressed as needed.  Functional ability and level of safety reviewed and appropriate. Education, counseling and referrals performed based on assessed risks today. Patient provided with a copy of personalized plan for preventive services.  Need for prophylactic vaccination with combined diphtheria-tetanus-pertussis (DTP) vaccine -     Tdap vaccine greater than or equal to 7yo IM  Morbid obesity ([place]) Encouraged diet and exercise. -     CBC with Differential/Platelet -     Comprehensive metabolic panel -     Hemoglobin A1c  Hypothyroidism, unspecified type Per Dr. Cruzita Lederer.  Essential hypertension Controlled.  Encounter for lipid screening for cardiovascular disease -     Lipid panel    Well Adult Exam: Labs ordered: Yes. Patient counseling was done. See below for items discussed. Discussed the patient's BMI.  The BMI is not in the acceptable range; BMI management plan is completed Follow up in one year. Breast cancer screening: patient given list of places.  Cervical cancer screening: UTD   Patient Counseling: [x]    Nutrition: Stressed importance of moderation in sodium/caffeine intake, saturated fat and cholesterol, caloric balance, sufficient intake of fresh fruits, vegetables, fiber, calcium, iron, and 1 mg of folate supplement per day (for females capable of pregnancy).  [x]    Stressed the importance of regular exercise.   [x]    Substance Abuse: Discussed cessation/primary prevention of tobacco, alcohol, or other drug use; driving or other dangerous activities under the influence; availability of treatment for abuse.   [x]    Injury prevention: Discussed safety belts, safety helmets, smoke detector, smoking near bedding or upholstery.   [x]    Sexuality: Discussed sexually transmitted diseases, partner selection, use of condoms, avoidance of unintended pregnancy  and contraceptive alternatives.  [x]    Dental health: Discussed importance of regular tooth brushing, flossing, and dental visits.  [x]    Health  maintenance and immunizations reviewed. Please refer to Health maintenance section.   CMA or LPN served as scribe during this visit. History, Physical, and Plan performed by medical provider. The above documentation has been reviewed and is accurate and complete.  Inda Coke, PA-C [place]

## 2018-08-25 LAB — LIPID PANEL
Cholesterol: 152 mg/dL (ref 0–200)
HDL: 36.1 mg/dL — AB (ref >39.00)
LDL Cholesterol: 92 mg/dL (ref 0–99)
NonHDL: 116.04
Total CHOL/HDL Ratio: 4
Triglycerides: 121 mg/dL (ref 0.0–149.0)
VLDL: 24.2 mg/dL (ref 0.0–40.0)

## 2018-08-25 LAB — COMPREHENSIVE METABOLIC PANEL
ALT: 28 U/L (ref 0–35)
AST: 25 U/L (ref 0–37)
Albumin: 4.3 g/dL (ref 3.5–5.2)
Alkaline Phosphatase: 62 U/L (ref 39–117)
BUN: 13 mg/dL (ref 6–23)
CALCIUM: 9.4 mg/dL (ref 8.4–10.5)
CO2: 29 mEq/L (ref 19–32)
Chloride: 102 mEq/L (ref 96–112)
Creatinine, Ser: 0.88 mg/dL (ref 0.40–1.20)
GFR: 70.1 mL/min (ref >60.00)
Glucose, Bld: 92 mg/dL (ref 70–99)
Potassium: 4.6 mEq/L (ref 3.5–5.1)
Sodium: 138 mEq/L (ref 135–145)
Total Bilirubin: 0.6 mg/dL (ref 0.2–1.2)
Total Protein: 6.8 g/dL (ref 6.0–8.3)

## 2018-08-25 LAB — CBC WITH DIFFERENTIAL/PLATELET
Basophils Absolute: 0.1 10*3/uL (ref 0.0–0.1)
Basophils Relative: 1.2 % (ref 0.0–3.0)
Eosinophils Absolute: 0.2 10*3/uL (ref 0.0–0.7)
Eosinophils Relative: 3 % (ref 0.0–5.0)
HCT: 41.1 % (ref 36.0–46.0)
Hemoglobin: 13.9 g/dL (ref 12.0–15.0)
Lymphocytes Relative: 22.3 % (ref 12.0–46.0)
Lymphs Abs: 1.8 10*3/uL (ref 0.7–4.0)
MCHC: 33.9 g/dL (ref 30.0–36.0)
MCV: 91.4 fl (ref 78.0–100.0)
MONOS PCT: 8.2 % (ref 3.0–12.0)
Monocytes Absolute: 0.7 10*3/uL (ref 0.1–1.0)
NEUTROS ABS: 5.3 10*3/uL (ref 1.4–7.7)
Neutrophils Relative %: 65.3 % (ref 43.0–77.0)
Platelets: 335 10*3/uL (ref 150.0–400.0)
RBC: 4.5 Mil/uL (ref 3.87–5.11)
RDW: 13 % (ref 11.5–15.5)
WBC: 8 10*3/uL (ref 4.0–10.5)

## 2018-08-25 LAB — HEMOGLOBIN A1C: Hgb A1c MFr Bld: 5.4 % (ref 4.6–6.5)

## 2018-09-04 ENCOUNTER — Other Ambulatory Visit: Payer: Self-pay | Admitting: Internal Medicine

## 2018-09-24 ENCOUNTER — Other Ambulatory Visit: Payer: Self-pay | Admitting: Family Medicine

## 2018-09-26 MED ORDER — LOSARTAN POTASSIUM 50 MG PO TABS: 50.0000 mg | ORAL_TABLET | Freq: Every day | ORAL | 0 refills | Status: DC

## 2018-11-13 ENCOUNTER — Other Ambulatory Visit: Payer: Self-pay | Admitting: Internal Medicine

## 2018-11-14 MED ORDER — LEVOTHYROXINE SODIUM 200 MCG PO TABS: 200.0000 ug | ORAL_TABLET | Freq: Every day | ORAL | 0 refills | Status: DC

## 2018-12-18 ENCOUNTER — Other Ambulatory Visit: Payer: Self-pay | Admitting: Family Medicine

## 2018-12-19 MED ORDER — LOSARTAN POTASSIUM 50 MG PO TABS: 50.0000 mg | ORAL_TABLET | Freq: Every day | ORAL | 1 refills | Status: DC

## 2018-12-23 ENCOUNTER — Other Ambulatory Visit: Payer: Self-pay | Admitting: Internal Medicine

## 2019-03-04 ENCOUNTER — Other Ambulatory Visit: Payer: Self-pay | Admitting: Internal Medicine

## 2019-03-06 MED ORDER — LEVOTHYROXINE SODIUM 200 MCG PO TABS: 200.0000 ug | ORAL_TABLET | Freq: Every day | ORAL | 0 refills | Status: DC

## 2019-03-27 ENCOUNTER — Encounter: Payer: Self-pay | Admitting: Physician Assistant

## 2019-04-06 ENCOUNTER — Other Ambulatory Visit: Payer: Self-pay

## 2019-04-06 ENCOUNTER — Other Ambulatory Visit (HOSPITAL_BASED_OUTPATIENT_CLINIC_OR_DEPARTMENT_OTHER): Payer: Self-pay | Admitting: Physician Assistant

## 2019-04-06 ENCOUNTER — Ambulatory Visit (HOSPITAL_BASED_OUTPATIENT_CLINIC_OR_DEPARTMENT_OTHER)
Admission: RE | Admit: 2019-04-06 | Discharge: 2019-04-06 | Disposition: A | Discharge status: Home / Self Care | Payer: BC Managed Care – PPO | Source: Ambulatory Visit | Attending: Physician Assistant | Admitting: Physician Assistant

## 2019-04-06 DIAGNOSIS — Z1231 Encounter for screening mammogram for malignant neoplasm of breast: Secondary | ICD-10-CM

## 2019-04-12 ENCOUNTER — Ambulatory Visit: Payer: BC Managed Care – PPO | Admitting: Physician Assistant

## 2019-04-20 ENCOUNTER — Encounter: Payer: Self-pay | Admitting: Physician Assistant

## 2019-04-20 ENCOUNTER — Ambulatory Visit: Payer: BC Managed Care – PPO | Admitting: Physician Assistant

## 2019-04-20 ENCOUNTER — Other Ambulatory Visit: Payer: Self-pay

## 2019-04-20 VITALS — BP 110/64 | HR 67 | Temp 98.3°F | Ht 65.75 in | Wt 204.0 lb

## 2019-04-20 DIAGNOSIS — I10 Essential (primary) hypertension: Secondary | ICD-10-CM | POA: Diagnosis not present

## 2019-04-20 NOTE — Patient Instructions (Signed)
It was great to see you!  Decrease Losartan 50 mg to Losartan 25 mg daily. If you have any issues with cutting your tablet, let me know and we can send in the Losartan 25 mg tablets.  Let's follow-up in 2-4 weeks, sooner if you have concerns.  Take care,  Inda Coke PA-C

## 2019-04-20 NOTE — Progress Notes (Signed)
Carolyn Harvey is a 43 y.o. female is here to discuss: Hypertension  I acted as a Education administrator for [employer], PA-C Carolyn Pickler, LPN  History of Present Illness:   Chief Complaint  Patient presents with  . Hypertension    HPI   Hypertension Pt following up today on blood pressure wants to know if she can come off her medication. She is currently taking Losartan 50 mg daily. Pt is monitoring blood pressure at home, uses a wrist cuff. Blood pressure has been averaging 120/70. Pt denies headaches,  blurred vision, chest pain, SOB or lower leg edema. Pt has been having some dizziness off and on x several months. Denies excessive caffeine intake, she drinks 2 cups of coffee a day,  no stimulant usage, excessive alcohol intake or increase in salt consumption. She has intentionally lost 60 pounds since January!  BP Readings from Last 3 Encounters:  04/20/19 110/64  08/24/18 110/80  06/17/18 120/78   Wt Readings from Last 5 Encounters:  04/20/19 204 lb (92.5 kg)  08/24/18 262 lb 6.1 oz (119 kg)  06/17/18 265 lb (120.2 kg)  09/29/17 261 lb (118.4 kg)  06/29/17 260 lb 3.2 oz (118 kg)      Health Maintenance Due  Topic Date Due  . PAP SMEAR-Modifier  06/13/2019    Past Medical History:  Diagnosis Date  . Allergy   . Hypertension   . Thyroid disease      Social History   Socioeconomic History  . Marital status: Single    Spouse name: Not on file  . Number of children: Not on file  . Years of education: Not on file  . Highest education level: Not on file  Occupational History  . Not on file  Social Needs  . Financial resource strain: Not on file  . Food insecurity    Worry: Not on file    Inability: Not on file  . Transportation needs    Medical: Not on file    Non-medical: Not on file  Tobacco Use  . Smoking status: Never Smoker  . Smokeless tobacco: Never Used  Substance and Sexual Activity  . Alcohol use: Yes  . Drug use: No  . Sexual activity: Yes   Lifestyle  . Physical activity    Days per week: Not on file    Minutes per session: Not on file  . Stress: Not on file  Relationships  . Social Herbalist on phone: Not on file    Gets together: Not on file    Attends religious service: Not on file    Active member of club or organization: Not on file    Attends meetings of clubs or organizations: Not on file    Relationship status: Not on file  . Intimate partner violence    Fear of current or ex partner: Not on file    Emotionally abused: Not on file    Physically abused: Not on file    Forced sexual activity: Not on file  Other Topics Concern  . Not on file  Social History Narrative   [facility] [employer] [facility]   Single with same partner   No children, 2 dogs    Past Surgical History:  Procedure Laterality Date  . CHOLECYSTECTOMY    . TONSILLECTOMY    . WISDOM TOOTH EXTRACTION      Family History  Problem Relation Age of Onset  . Asthma Mother   . Miscarriages / [place] Mother   .  Hypothyroidism Mother   . Colon cancer Mother 82  . Diabetes Father   . Hypertension Father   . Pulmonary embolism Father   . Miscarriages / Stillbirths Sister   . Fibromyalgia Sister   . Hypothyroidism Sister   . CVA Maternal Grandfather   . Alzheimer's disease Paternal Grandmother   . Stroke Paternal Grandfather   . Stomach cancer Paternal Uncle   . Breast cancer Neg Hx     PMHx, SurgHx, SocialHx, FamHx, Medications, and Allergies were reviewed in the Visit Navigator and updated as appropriate.   Patient Active Problem List   Diagnosis Date Noted  . Hypothyroidism due to Hashimoto's thyroiditis 06/17/2018  . Hypertension 05/21/2017  . Thyroid disease 05/21/2017    Social History   Tobacco Use  . Smoking status: Never Smoker  . Smokeless tobacco: Never Used  Substance Use Topics  . Alcohol use: Yes  . Drug use: No    Current Medications and Allergies:    Current Outpatient Medications:  .   cetirizine (ZYRTEC) 10 MG tablet, Take 1 tablet by mouth daily., Disp: , Rfl:  .  fluticasone (FLONASE) 50 MCG/ACT nasal spray, Place 2 sprays daily into both nostrils., Disp: 16 g, Rfl: 6 .  levonorgestrel (MIRENA) 20 MCG/24HR IUD, 1 each by Intrauterine route once., Disp: , Rfl:  .  levothyroxine (SYNTHROID) 200 MCG tablet, Take 1 tablet (200 mcg total) by mouth daily., Disp: 90 tablet, Rfl: 0 .  levothyroxine (SYNTHROID) 25 MCG tablet, TAKE ONE TABLET BY MOUTH DAILY BEFORE BREAKFAST, Disp: 90 tablet, Rfl: 4 .  losartan (COZAAR) 50 MG tablet, Take 1 tablet (50 mg total) by mouth daily., Disp: 90 tablet, Rfl: 1  No Known Allergies  Review of Systems   ROS Negative unless otherwise specified per HPI.  Vitals:   Vitals:   04/20/19 1011  BP: 110/64  Pulse: 67  Temp: 98.3 F (36.8 C)  TempSrc: Temporal  SpO2: 97%  Weight: 204 lb (92.5 kg)  Height: 5' 5.75" (1.67 m)     Body mass index is 33.18 kg/m.   Physical Exam:    Physical Exam Vitals signs and nursing note reviewed.  Constitutional:      General: She is not in acute distress.    Appearance: She is well-developed. She is not ill-appearing or toxic-appearing.  Cardiovascular:     Rate and Rhythm: Normal rate and regular rhythm.     Pulses: Normal pulses.     Heart sounds: Normal heart sounds, S1 normal and S2 normal.     Comments: No LE edema Pulmonary:     Effort: Pulmonary effort is normal.     Breath sounds: Normal breath sounds.  Skin:    General: Skin is warm and dry.  Neurological:     Mental Status: She is alert.     GCS: GCS eye subscore is 4. GCS verbal subscore is 5. GCS motor subscore is 6.  Psychiatric:        Speech: Speech normal.        Behavior: Behavior normal. Behavior is cooperative.      Assessment and Plan:    Carolyn Harvey was seen today for hypertension.  Diagnoses and all orders for this visit:  Essential hypertension   Normotensive, however she has lost 60 lb and I do think that she  is possibly getting hypotensive at times. We are going to decrease Losartan to 25 mg daily. Follow-up in 2-4 weeks.  . Reviewed expectations re: course of current medical issues. Marland Kitchen  Discussed self-management of symptoms. . Outlined signs and symptoms indicating need for more acute intervention. . Patient verbalized understanding and all questions were answered. . See orders for this visit as documented in the electronic medical record. . Patient received an After Visit Summary.  CMA or LPN served as scribe during this visit. History, Physical, and Plan performed by medical provider. The above documentation has been reviewed and is accurate and complete.   Inda Coke, PA-C , [place] 04/20/2019  Follow-up: No follow-ups on file.

## 2019-06-02 ENCOUNTER — Other Ambulatory Visit: Payer: Self-pay | Admitting: Internal Medicine

## 2019-06-05 MED ORDER — LEVOTHYROXINE SODIUM 200 MCG PO TABS: 200.0000 ug | ORAL_TABLET | Freq: Every day | ORAL | 0 refills | Status: DC

## 2019-06-20 ENCOUNTER — Ambulatory Visit: Payer: BC Managed Care – PPO | Admitting: Internal Medicine

## 2019-06-20 ENCOUNTER — Encounter: Payer: Self-pay | Admitting: Internal Medicine

## 2019-06-20 ENCOUNTER — Other Ambulatory Visit: Payer: Self-pay

## 2019-06-20 ENCOUNTER — Encounter: Payer: Self-pay | Admitting: Physician Assistant

## 2019-06-20 VITALS — BP 130/70 | HR 64 | Ht 65.75 in | Wt 192.0 lb

## 2019-06-20 DIAGNOSIS — E063 Autoimmune thyroiditis: Secondary | ICD-10-CM | POA: Diagnosis not present

## 2019-06-20 DIAGNOSIS — E038 Other specified hypothyroidism: Secondary | ICD-10-CM | POA: Diagnosis not present

## 2019-06-20 LAB — TSH: TSH: 0.08 u[IU]/mL — ABNORMAL LOW (ref 0.35–4.50)

## 2019-06-20 LAB — T4, FREE: Free T4: 1.37 ng/dL (ref 0.60–1.60)

## 2019-06-20 NOTE — Progress Notes (Signed)
Patient ID: Carolyn Harvey, female   DOB: [birth year], 43 y.o.   MRN: [id number]    HPI  Carolyn Harvey is a 43 y.o.-year-old female, returning for f/u for hypothyroidism due to Hashimoto's thyroiditis.  Last visit a year ago.  Since last visit, patient lost a significant amount of weight, almost 75 pounds, after starting to work at home and cooking most of her meals.  She is also active, doing yoga.  She feels much better.  Pt. has been dx with hypothyroidism in early 1990s >> on generic levothyroxine. Prev. On Synthroid DAW.  Pt is on levothyroxine 225 mcg daily, taken: - in am - fasting - at least 30 min from b'fast - no Ca, Fe, PPIs - + MVIs at night - not on Biotin  Reviewed her TFTs: Lab Results  Component Value Date   TSH 0.93 06/17/2018   TSH 7.05 (H) 09/29/2017   TSH 4.63 (H) 06/29/2017   FREET4 1.23 06/17/2018   FREET4 1.03 09/29/2017   FREET4 1.34 06/29/2017    Her TPO antibodies are positive in the past Component     Latest Ref Rng & Units 09/29/2017  Thyroglobulin Ab     < or = 1 IU/mL 190 (H)  Thyroperoxidase Ab SerPl-aCnc     <9 IU/mL 6   Pt denies: - feeling nodules in neck - hoarseness - dysphagia - choking - SOB with lying down  She has positive family history of thyroid disorders: M - hyperthyroidism -  Had to have RAI tx; 2 sisters. + sister with RA.   + FH of thyroid cancer in first cousin. No h/o radiation tx to head or neck.  No herbal supplements. No Biotin use. No recent steroids use.   Pt. also has a history of HTN. On Losartan - dose halved since last visit.  Mother recent dx'ed with colon cancer - now doing ChTx.  ROS: Constitutional: no weight gain/+++ weight loss, no fatigue, no subjective hyperthermia, no subjective hypothermia Eyes: no blurry vision, no xerophthalmia ENT: no sore throat, + see HPI Cardiovascular: no CP/no SOB/no palpitations/+ L leg swelling (chronic) Respiratory: no cough/no SOB/no wheezing Gastrointestinal: no  N/no V/no D/no C/no acid reflux Musculoskeletal: no muscle aches/no joint aches Skin: no rashes, no hair loss Neurological: no tremors/no numbness/no tingling/no dizziness  I reviewed pt's medications, allergies, PMH, social hx, family hx, and changes were documented in the history of present illness. Otherwise, unchanged from my initial visit note.  Past Medical History:  Diagnosis Date  . Allergy   . Hypertension   . Thyroid disease    Past Surgical History:  Procedure Laterality Date  . CHOLECYSTECTOMY    . TONSILLECTOMY    . WISDOM TOOTH EXTRACTION     Social History   Socioeconomic History  . Marital status: Single    Spouse name: Not on file  . Number of children: 0  Occupational History  . librarian  Tobacco Use  . Smoking status: Never Smoker  . Smokeless tobacco: Never Used  Substance and Sexual Activity  . Alcohol use: Yes, beer, 1-2x a week  . Drug use: No  . Sexual activity: Yes  Other Topics Concern  . Not on file  Social History Narrative   [employer]   Single with same partner   No children, 2 dogs   Current Outpatient Medications on File Prior to Visit  Medication Sig Dispense Refill  . cetirizine (ZYRTEC) 10 MG tablet Take 1 tablet by mouth daily.    Marland Kitchen  fluticasone (FLONASE) 50 MCG/ACT nasal spray Place 2 sprays daily into both nostrils. 16 g 6  . levonorgestrel (MIRENA) 20 MCG/24HR IUD 1 each by Intrauterine route once.    Marland Kitchen levothyroxine (SYNTHROID) 200 MCG tablet Take 1 tablet (200 mcg total) by mouth daily. 90 tablet 0  . levothyroxine (SYNTHROID) 25 MCG tablet TAKE ONE TABLET BY MOUTH DAILY BEFORE BREAKFAST 90 tablet 4  . losartan (COZAAR) 50 MG tablet Take 1 tablet (50 mg total) by mouth daily. (Patient taking differently: Take 25 mg by mouth daily. ) 90 tablet 1   No current facility-administered medications on file prior to visit.    No Known Allergies Family History  Problem Relation Age of Onset  . Asthma Mother   .  Miscarriages / [place] Mother   . Hypothyroidism Mother   . Colon cancer Mother 41  . Diabetes Father   . Hypertension Father   . Pulmonary embolism Father   . Miscarriages / Stillbirths Sister   . Fibromyalgia Sister   . Hypothyroidism Sister   . CVA Maternal Grandfather   . Alzheimer's disease Paternal Grandmother   . Stroke Paternal Grandfather   . Stomach cancer Paternal Uncle   . Breast cancer Neg Hx     PE: BP 130/70   Pulse 64   Ht 5' 5.75" (1.67 m)   Wt 192 lb (87.1 kg)   SpO2 98%   BMI 31.23 kg/m  Wt Readings from Last 3 Encounters:  06/20/19 192 lb (87.1 kg)  04/20/19 204 lb (92.5 kg)  08/24/18 262 lb 6.1 oz (119 kg)   Constitutional: overweight, in NAD Eyes: PERRLA, EOMI, no exophthalmos ENT: moist mucous membranes, no thyromegaly, no cervical lymphadenopathy Cardiovascular: RRR, No MRG, + left leg swelling (chronic) Respiratory: CTA B Gastrointestinal: abdomen soft, NT, ND, BS+ Musculoskeletal: no deformities, strength intact in all 4 Skin: moist, warm, no rashes Neurological: no tremor with outstretched hands, DTR normal in all 4  ASSESSMENT: 1. Hypothyroidism due to Hashimoto's thyroiditis  PLAN:  1. Patient with longstanding hypothyroidism, diagnosed in high school, on high-dose levothyroxine therapy.  We recently also diagnosed Hashimoto's thyroiditis as a cause for her hypothyroidism. - latest thyroid labs reviewed with pt >> normal a year ago: Lab Results  Component Value Date   TSH 0.93 06/17/2018   - she continues on LT4 225 mcg daily - pt feels good on this dose.  However, she lost 73 pounds in the last year (!!!!) and we discussed that such as significant weight loss may trigger the need to decrease her levothyroxine dose. - we discussed about taking the thyroid hormone every day, with water, >30 minutes before breakfast, separated by >4 hours from acid reflux medications, calcium, iron, multivitamins. Pt. is taking it correctly. - will  check thyroid tests today: TSH and fT4 - If labs are abnormal, she will need to return for repeat TFTs in 1.5 months - At last visit, we discussed about her Hashimoto's thyroiditis diagnosis and I suggested changes in her diet (stopping dairy products) and also adding selenium 200 mcg daily  - She continues selenium now - We will recheck her thyroid antibodies today - RTC in 1 year, but possibly sooner for labs  - time spent with the patient: 20 min, of which >50% was spent in obtaining information about her symptoms, reviewing her previous labs, evaluations, and treatments, counseling her about her condition (please see the discussed topics above), and developing a plan to further investigate and treat it; she had  a number of questions which I addressed.  Component     Latest Ref Rng & Units 06/20/2019  T4,Free(Direct)     0.60 - 1.60 ng/dL 1.37  TSH     0.35 - 4.50 uIU/mL 0.08 (L)  Thyroperoxidase Ab SerPl-aCnc     <9 IU/mL 5   As expected, your TSH is suppressed.  We will need to decrease the levothyroxine dose to 175 mcg daily and have her back for labs in 1.5 months.  Philemon Kingdom, MD PhD [facility] End[facility]

## 2019-06-20 NOTE — Patient Instructions (Signed)
Please stop at the lab.  Please continue Levothyroxine 225 mcg daily.  Take the thyroid hormone every day, with water, at least 30 minutes before breakfast, separated by at least 4 hours from: - acid reflux medications - calcium - iron - multivitamins  Please come back for a follow-up appointment in 1 year.

## 2019-06-21 ENCOUNTER — Other Ambulatory Visit: Payer: Self-pay

## 2019-06-21 ENCOUNTER — Other Ambulatory Visit: Payer: Self-pay | Admitting: Internal Medicine

## 2019-06-21 DIAGNOSIS — E038 Other specified hypothyroidism: Secondary | ICD-10-CM

## 2019-06-21 MED ORDER — LEVOTHYROXINE SODIUM 175 MCG PO TABS: 175.0000 ug | ORAL_TABLET | Freq: Every day | ORAL | 3 refills | Status: DC

## 2019-06-22 LAB — TEST AUTHORIZATION

## 2019-06-22 LAB — THYROID PEROXIDASE ANTIBODY: Thyroperoxidase Ab SerPl-aCnc: 5 IU/mL (ref <9)

## 2019-06-22 LAB — THYROGLOBULIN ANTIBODY: Thyroglobulin Ab: 138 IU/mL — ABNORMAL HIGH (ref <=1)

## 2019-07-04 LAB — HM PAP SMEAR: HM Pap smear: NEGATIVE

## 2019-07-04 LAB — RESULTS CONSOLE HPV: CHL HPV: NEGATIVE

## 2019-07-31 ENCOUNTER — Other Ambulatory Visit: Payer: BC Managed Care – PPO

## 2019-09-04 ENCOUNTER — Other Ambulatory Visit: Payer: Self-pay | Admitting: Family Medicine

## 2019-09-12 ENCOUNTER — Other Ambulatory Visit: Payer: BC Managed Care – PPO

## 2019-11-21 ENCOUNTER — Other Ambulatory Visit: Payer: Self-pay

## 2019-11-21 ENCOUNTER — Other Ambulatory Visit (INDEPENDENT_AMBULATORY_CARE_PROVIDER_SITE_OTHER): Payer: BC Managed Care – PPO

## 2019-11-21 DIAGNOSIS — E063 Autoimmune thyroiditis: Secondary | ICD-10-CM | POA: Diagnosis not present

## 2019-11-21 DIAGNOSIS — E038 Other specified hypothyroidism: Secondary | ICD-10-CM

## 2019-11-21 LAB — T4, FREE: Free T4: 1.23 ng/dL (ref 0.60–1.60)

## 2019-11-21 LAB — TSH: TSH: 3.67 u[IU]/mL (ref 0.35–4.50)

## 2019-11-22 LAB — THYROGLOBULIN ANTIBODY: Thyroglobulin Ab: 150 IU/mL — ABNORMAL HIGH (ref <=1)

## 2019-11-23 ENCOUNTER — Encounter: Payer: Self-pay | Admitting: Internal Medicine

## 2019-11-24 ENCOUNTER — Other Ambulatory Visit: Payer: Self-pay | Admitting: Internal Medicine

## 2019-11-24 MED ORDER — LEVOTHYROXINE SODIUM 175 MCG PO TABS: 175.0000 ug | ORAL_TABLET | Freq: Every day | ORAL | 3 refills | Status: DC

## 2020-03-12 ENCOUNTER — Ambulatory Visit: Payer: BC Managed Care – PPO | Admitting: Physician Assistant

## 2020-03-18 ENCOUNTER — Ambulatory Visit: Payer: BC Managed Care – PPO | Admitting: Physician Assistant

## 2020-03-22 ENCOUNTER — Ambulatory Visit: Payer: BC Managed Care – PPO | Admitting: Physician Assistant

## 2020-03-22 ENCOUNTER — Encounter: Payer: Self-pay | Admitting: Physician Assistant

## 2020-03-22 ENCOUNTER — Other Ambulatory Visit: Payer: Self-pay

## 2020-03-22 VITALS — BP 100/60 | HR 67 | Temp 98.2°F | Ht 65.75 in | Wt 169.4 lb

## 2020-03-22 DIAGNOSIS — F419 Anxiety disorder, unspecified: Secondary | ICD-10-CM | POA: Diagnosis not present

## 2020-03-22 DIAGNOSIS — F32A Depression, unspecified: Secondary | ICD-10-CM | POA: Insufficient documentation

## 2020-03-22 DIAGNOSIS — I10 Essential (primary) hypertension: Secondary | ICD-10-CM

## 2020-03-22 DIAGNOSIS — Z800 Family history of malignant neoplasm of digestive organs: Secondary | ICD-10-CM | POA: Diagnosis not present

## 2020-03-22 DIAGNOSIS — F329 Major depressive disorder, single episode, unspecified: Secondary | ICD-10-CM | POA: Diagnosis not present

## 2020-03-22 MED ORDER — FLUOXETINE HCL 20 MG PO CAPS: 20.0000 mg | ORAL_CAPSULE | Freq: Every day | ORAL | 1 refills | Status: DC

## 2020-03-22 NOTE — Progress Notes (Signed)
Carolyn Harvey is a 44 y.o. female here for a new problem.  I acted as a Education administrator for [employer], PA-C [employer], LPN   History of Present Illness:   Chief Complaint  Patient presents with   Hypertension   Anxiety   Depression    HPI   Hypertension Pt following up, currently taking Losartan 25 mg every other day was decreased at last visit 04/2019. Pt has been checking Bp once a week averaging 130/72. Pt denies headaches, dizziness, blurred vision, chest pain, SOB or lower leg edema. Denies excessive caffeine intake, stimulant usage, excessive alcohol intake or increase in salt consumption.  She continues to work towards weight loss, see below.  Wt Readings from Last 4 Encounters:  03/22/20 169 lb 6.1 oz (76.8 kg)  06/20/19 192 lb (87.1 kg)  04/20/19 204 lb (92.5 kg)  08/24/18 262 lb 6.1 oz (119 kg)   BP Readings from Last 3 Encounters:  03/22/20 100/60  06/20/19 130/70  04/20/19 110/64     Anxiety/Depression Pt c/o feeling down and having little interest in things for the past year. Having trouble concentrating and is anxious. She has had 2 deaths in the family and mother dx with colon cancer but is now clear in the past year, and the world with COVID. Feels "more snippy" and threshold to react to things. Boyfriend of 8 years is her major support person. Denies SI/HI.  Depression screen [facility] 2/9 03/22/2020 08/24/2018 06/29/2017  Decreased Interest 3 0 0  Down, Depressed, Hopeless 3 0 0  PHQ - 2 Score 6 0 0  Altered sleeping 1 - -  Tired, decreased energy 2 - -  Change in appetite 0 - -  Feeling bad or failure about yourself  1 - -  Trouble concentrating 3 - -  Moving slowly or fidgety/restless 2 - -  Suicidal thoughts 0 - -  PHQ-9 Score 15 - -  Difficult doing work/chores Very difficult - -   GAD 7 : Generalized Anxiety Score 03/22/2020  Nervous, Anxious, on Edge 3  Control/stop worrying 3  Worry too much - different things 3  Trouble relaxing 3   Restless 3  Easily annoyed or irritable 3  Afraid - awful might happen 2  Total GAD 7 Score 20  Anxiety Difficulty Very difficult     Colon cancer screening Mom just completed treatment for Stage IIIb colon cancer. Patient is going to be 44 this December, interested in colon cancer screening.   Past Medical History:  Diagnosis Date   Allergy    Hypertension    Thyroid disease      Social History   Tobacco Use   Smoking status: Never Smoker   Smokeless tobacco: Never Used  Vaping Use   Vaping Use: Never used  Substance Use Topics   Alcohol use: Yes   Drug use: No    Past Surgical History:  Procedure Laterality Date   CHOLECYSTECTOMY     TONSILLECTOMY     WISDOM TOOTH EXTRACTION      Family History  Problem Relation Age of Onset   Asthma Mother    11 / Stillbirths Mother    Hypothyroidism Mother    Colon cancer Mother 47   Diabetes Father    Hypertension Father    Pulmonary embolism Father    Miscarriages / Stillbirths Sister    Fibromyalgia Sister    Hypothyroidism Sister    CVA Maternal Grandfather    Alzheimer's disease Paternal Grandmother  Stroke Paternal Grandfather    Stomach cancer Paternal Uncle    Breast cancer Neg Hx     No Known Allergies  Current Medications:   Current Outpatient Medications:    cetirizine (ZYRTEC) 10 MG tablet, Take 1 tablet by mouth daily., Disp: , Rfl:    fluticasone (FLONASE) 50 MCG/ACT nasal spray, Place 2 sprays daily into both nostrils., Disp: 16 g, Rfl: 6   levonorgestrel (MIRENA) 20 MCG/24HR IUD, 1 each by Intrauterine route once., Disp: , Rfl:    levothyroxine (SYNTHROID) 175 MCG tablet, Take 1 tablet (175 mcg total) by mouth daily before breakfast., Disp: 90 tablet, Rfl: 3   losartan (COZAAR) 50 MG tablet, TAKE ONE TABLET BY MOUTH DAILY (Patient taking differently: Take 25 mg by mouth every other day. ), Disp: 90 tablet, Rfl: 0   Multiple Vitamin (MULTIVITAMIN)  tablet, Take 1 tablet by mouth daily., Disp: , Rfl:    FLUoxetine (PROZAC) 20 MG capsule, Take 1 capsule (20 mg total) by mouth daily., Disp: 30 capsule, Rfl: 1   Review of Systems:   ROS  Negative unless otherwise specified per HPI.   Vitals:   Vitals:   03/22/20 1114  BP: 100/60  Pulse: 67  Temp: 98.2 F (36.8 C)  TempSrc: Temporal  SpO2: 98%  Weight: 169 lb 6.1 oz (76.8 kg)  Height: 5' 5.75" (1.67 m)     Body mass index is 27.55 kg/m.  Physical Exam:   Physical Exam Vitals and nursing note reviewed.  Constitutional:      General: She is not in acute distress.    Appearance: She is well-developed. She is not ill-appearing or toxic-appearing.  Cardiovascular:     Rate and Rhythm: Normal rate and regular rhythm.     Pulses: Normal pulses.     Heart sounds: Normal heart sounds, S1 normal and S2 normal.     Comments: No LE edema Pulmonary:     Effort: Pulmonary effort is normal.     Breath sounds: Normal breath sounds.  Skin:    General: Skin is warm and dry.  Neurological:     Mental Status: She is alert.     GCS: GCS eye subscore is 4. GCS verbal subscore is 5. GCS motor subscore is 6.  Psychiatric:        Speech: Speech normal.        Behavior: Behavior normal. Behavior is cooperative.      Assessment and Plan:   Carolyn Harvey was seen today for hypertension, anxiety and depression.  Diagnoses and all orders for this visit:  Essential hypertension With her recent intentional weight loss, I think we can trial stopping medication. Follow-up in 6 weeks virtually to reassess this and her mood. Low threshold to restart low dose losartan if needed.  Anxiety and depression Uncontrolled. Denies SI/HI. Start Prozac 20 mg. Side effects discussed. I discussed with patient that if they develop any SI, to tell someone immediately and seek medical attention. Follow-up in 6 weeks virtually, sooner if concerns.  Family history of colon cancer in mother She is turning  27 in December -- referral for colonoscopy. Denies any concerns/symptoms.  Other orders -     FLUoxetine (PROZAC) 20 MG capsule; Take 1 capsule (20 mg total) by mouth daily.     Reviewed expectations re: course of current medical issues.  Discussed self-management of symptoms.  Outlined signs and symptoms indicating need for more acute intervention.  Patient verbalized understanding and all questions were answered.  See orders  for this visit as documented in the electronic medical record.  Patient received an After-Visit Summary.  CMA or LPN served as scribe during this visit. History, Physical, and Plan performed by medical provider. The above documentation has been reviewed and is accurate and complete.  Inda Coke, PA-C

## 2020-03-22 NOTE — Patient Instructions (Addendum)
It was great to see you.  Stop your blood pressure medicine.   You will get a call about your colonoscopy.  Start Prozac 20 mg daily.  Tell someone you trust you are taking this. If feeling suicidal, stop medication and tell someone. Behavioral health urgent care is at [address] [place] open 24/7.  Follow-up virtually in 6 months, sooner if concerns.  Aldona Bar

## 2020-03-25 ENCOUNTER — Ambulatory Visit: Payer: BC Managed Care – PPO | Admitting: Physician Assistant

## 2020-04-19 ENCOUNTER — Encounter: Payer: Self-pay | Admitting: Physician Assistant

## 2020-05-20 ENCOUNTER — Other Ambulatory Visit: Payer: Self-pay | Admitting: Physician Assistant

## 2020-06-19 ENCOUNTER — Ambulatory Visit: Payer: BC Managed Care – PPO | Admitting: Internal Medicine

## 2020-07-01 ENCOUNTER — Encounter: Payer: Self-pay | Admitting: Internal Medicine

## 2020-07-01 ENCOUNTER — Other Ambulatory Visit: Payer: Self-pay

## 2020-07-01 ENCOUNTER — Ambulatory Visit: Payer: BC Managed Care – PPO | Admitting: Internal Medicine

## 2020-07-01 VITALS — BP 128/88 | HR 79 | Ht 65.0 in | Wt 179.8 lb

## 2020-07-01 DIAGNOSIS — E038 Other specified hypothyroidism: Secondary | ICD-10-CM | POA: Diagnosis not present

## 2020-07-01 DIAGNOSIS — E063 Autoimmune thyroiditis: Secondary | ICD-10-CM

## 2020-07-01 LAB — TSH: TSH: 3.67 u[IU]/mL (ref 0.35–4.50)

## 2020-07-01 LAB — T4, FREE: Free T4: 1.02 ng/dL (ref 0.60–1.60)

## 2020-07-01 NOTE — Progress Notes (Signed)
Patient ID: Carolyn Harvey, female   DOB: 03/09/1976, 44 y.o.   MRN: [id number]   This visit occurred during the SARS-CoV-2 public health emergency.  Safety protocols were in place, including screening questions prior to the visit, additional usage of staff PPE, and extensive cleaning of exam room while observing appropriate contact time as indicated for disinfecting solutions.   HPI  Carolyn Harvey is a 44 y.o.-year-old female, returning for f/u for hypothyroidism due to Hashimoto's thyroiditis.  Last visit 1 year ago.  Before last visit, in the previous year, patient lost a significant amount of weight, almost 75 pounds after starting to work at home and cooking most of her meals.  She was also active, doing yoga.    Since then, she returned to work in person 4 days a week but she continues to work on her diet and also start the strength exercises.  She lost another 23 lbs, but gained 10 back over Thanksgiving.  Pt. has been dx with hypothyroidism in early 1990s >> on generic levothyroxine.  Previously on Synthroid d.a.w.  She is on levothyroxine 175 mcg daily (decreased from 225 mcg at last visit): - in am - fasting - at least 30 min from b'fast - no calcium - no iron - + multivitamins at night - no PPIs - not on Biotin  Reviewed her TFTs: Lab Results  Component Value Date   TSH 3.67 11/21/2019   TSH 0.08 (L) 06/20/2019   TSH 0.93 06/17/2018   TSH 7.05 (H) 09/29/2017   TSH 4.63 (H) 06/29/2017   FREET4 1.23 11/21/2019   FREET4 1.37 06/20/2019   FREET4 1.23 06/17/2018   FREET4 1.03 09/29/2017   FREET4 1.34 06/29/2017    Her TPO antibodies were positive in the past: Component     Latest Ref Rng & Units 09/29/2017  Thyroglobulin Ab     < or = 1 IU/mL 190 (H)  Thyroperoxidase Ab SerPl-aCnc     <9 IU/mL 6   Pt denies: - feeling nodules in neck - hoarseness - dysphagia - choking - SOB with lying down  She has positive family history of thyroid disorders: M -  hyperthyroidism -  Had to have RAI tx; 2 sisters. + sister with RA.   + FH of thyroid cancer in first cousin. No h/o radiation tx to head or neck.  No herbal supplements. No Biotin use. No recent steroids use.    She has a history of HTN.  On losartan.  Mother was dx'ed with colon cancer before last visit.  She started Fluoxetine.  ROS: Constitutional: no weight gain/+ weight loss, no fatigue, no subjective hyperthermia, no subjective hypothermia Eyes: no blurry vision, no xerophthalmia ENT: no sore throat, + see HPI Cardiovascular: no CP/no SOB/no palpitations/+ chronic left leg swelling Respiratory: no cough/no SOB/no wheezing Gastrointestinal: no N/no V/no D/no C/no acid reflux Musculoskeletal: no muscle aches/no joint aches Skin: no rashes, no hair loss Neurological: no tremors/no numbness/no tingling/no dizziness  I reviewed pt's medications, allergies, PMH, social hx, family hx, and changes were documented in the history of present illness. Otherwise, unchanged from my initial visit note.   Past Medical History:  Diagnosis Date  . Allergy   . Hypertension   . Thyroid disease    Past Surgical History:  Procedure Laterality Date  . CHOLECYSTECTOMY    . TONSILLECTOMY    . WISDOM TOOTH EXTRACTION     Social History   Socioeconomic History  . Marital status: Single    Spouse  name: Not on file  . Number of children: 0  Occupational History  . librarian  Tobacco Use  . Smoking status: Never Smoker  . Smokeless tobacco: Never Used  Substance and Sexual Activity  . Alcohol use: Yes, beer, 1-2x a week  . Drug use: No  . Sexual activity: Yes  Other Topics Concern  . Not on file  Social History Narrative   [facility] [employer]   Single with same partner   No children, 2 dogs   Current Outpatient Medications on File Prior to Visit  Medication Sig Dispense Refill  . cetirizine (ZYRTEC) 10 MG tablet Take 1 tablet by mouth daily.    Marland Kitchen FLUoxetine (PROZAC) 20  MG capsule TAKE ONE CAPSULE BY MOUTH DAILY 30 capsule 1  . fluticasone (FLONASE) 50 MCG/ACT nasal spray Place 2 sprays daily into both nostrils. 16 g 6  . levonorgestrel (MIRENA) 20 MCG/24HR IUD 1 each by Intrauterine route once.    Marland Kitchen levothyroxine (SYNTHROID) 175 MCG tablet Take 1 tablet (175 mcg total) by mouth daily before breakfast. 90 tablet 3  . Multiple Vitamin (MULTIVITAMIN) tablet Take 1 tablet by mouth daily.     No current facility-administered medications on file prior to visit.   No Known Allergies Family History  Problem Relation Age of Onset  . Asthma Mother   . Miscarriages / [place] Mother   . Hypothyroidism Mother   . Colon cancer Mother 33  . Diabetes Father   . Hypertension Father   . Pulmonary embolism Father   . Miscarriages / Stillbirths Sister   . Fibromyalgia Sister   . Hypothyroidism Sister   . CVA Maternal Grandfather   . Alzheimer's disease Paternal Grandmother   . Stroke Paternal Grandfather   . Stomach cancer Paternal Uncle   . Breast cancer Neg Hx     PE: BP 128/88   Pulse 79   Ht 5\' 5"  (1.651 m)   Wt 179 lb 12.8 oz (81.6 kg)   SpO2 97%   BMI 29.92 kg/m  Wt Readings from Last 3 Encounters:  07/01/20 179 lb 12.8 oz (81.6 kg)  03/22/20 169 lb 6.1 oz (76.8 kg)  06/20/19 192 lb (87.1 kg)   Constitutional: Slightly overweight, in NAD Eyes: PERRLA, EOMI, no exophthalmos ENT: moist mucous membranes, no thyromegaly, no cervical lymphadenopathy Cardiovascular: RRR, No MRG, + left lower extremity edema Respiratory: CTA B Gastrointestinal: abdomen soft, NT, ND, BS+ Musculoskeletal: no deformities, strength intact in all 4 Skin: moist, warm, no rashes Neurological: no tremor with outstretched hands, DTR normal in all 4  ASSESSMENT: 1. Hypothyroidism due to Hashimoto's thyroiditis  PLAN:  1. Patient with longstanding hypothyroidism, diagnosed in high school, previously on high-dose levothyroxine therapy (225 mcg daily), which we were able  to decrease after significant weight loss before last visit of 73 pounds in a year.  Since then, she lost 13 more pounds -We diagnosed Hashimoto's thyroiditis based on elevated antithyroid antibodies.  I suggested stopping dairy products and also adding selenium 200 mcg daily.   - latest thyroid labs reviewed with pt >> normal: Lab Results  Component Value Date   TSH 3.67 11/21/2019   - she continues on LT4 175 mcg daily - pt feels good on this dose.  No tremors, palpitations, unintentional weight loss, heat intolerance. - we discussed about taking the thyroid hormone every day, with water, >30 minutes before breakfast, separated by >4 hours from acid reflux medications, calcium, iron, multivitamins. Pt. is taking it correctly. - will  check thyroid tests today: TSH and fT4 - If labs are abnormal, she will need to return for repeat TFTs in 1.5 months -I will see her back in a year but possibly sooner for labs  Component     Latest Ref Rng & Units 07/01/2020  T4,Free(Direct)     0.60 - 1.60 ng/dL 1.02  TSH     0.35 - 4.50 uIU/mL 3.67   TFTs are normal.  Philemon Kingdom, MD PhD [facility] End[facility]

## 2020-07-01 NOTE — Patient Instructions (Signed)
Please stop at the lab.  Please  continue Levothyroxine 175 mcg daily.  Take the thyroid hormone every day, with water, at least 30 minutes before breakfast, separated by at least 4 hours from: - acid reflux medications - calcium - iron - multivitamins  Please come back for a follow-up appointment in 1 year. 

## 2020-07-19 ENCOUNTER — Other Ambulatory Visit: Payer: Self-pay | Admitting: Physician Assistant

## 2020-07-23 ENCOUNTER — Ambulatory Visit (AMBULATORY_SURGERY_CENTER): Payer: Self-pay

## 2020-07-23 ENCOUNTER — Encounter: Payer: Self-pay | Admitting: Gastroenterology

## 2020-07-23 ENCOUNTER — Other Ambulatory Visit: Payer: Self-pay

## 2020-07-23 VITALS — Ht 65.0 in | Wt 181.0 lb

## 2020-07-23 DIAGNOSIS — Z800 Family history of malignant neoplasm of digestive organs: Secondary | ICD-10-CM

## 2020-07-23 DIAGNOSIS — Z1211 Encounter for screening for malignant neoplasm of colon: Secondary | ICD-10-CM

## 2020-07-23 MED ORDER — SUTAB 1479-225-188 MG PO TABS: 12.0000 | ORAL_TABLET | ORAL | 0 refills | Status: DC

## 2020-07-23 NOTE — Progress Notes (Signed)
No egg or soy allergy known to patient  No issues with past sedation with any surgeries or procedures No intubation problems in the past  No FH of Malignant Hyperthermia No diet pills per patient No home 02 use per patient  No blood thinners per patient  Pt denies issues with constipation  No A fib or A flutter  EMMI video to pt or via MyChart  COVID 19 guidelines implemented in PV today with Pt and RN  Pt is fully vaccinated  for Covid   Sutab  Coupon given to pt in PV today , Code to Pharmacy   Due to the COVID-19 pandemic we are asking patients to follow certain guidelines.  Pt aware of COVID protocols and [facility] guidelines   

## 2020-08-06 ENCOUNTER — Other Ambulatory Visit: Payer: Self-pay

## 2020-08-06 ENCOUNTER — Encounter: Payer: Self-pay | Admitting: Gastroenterology

## 2020-08-06 ENCOUNTER — Ambulatory Visit (AMBULATORY_SURGERY_CENTER): Payer: BC Managed Care – PPO | Admitting: Gastroenterology

## 2020-08-06 VITALS — BP 148/98 | HR 48 | Temp 97.3°F | Resp 16 | Ht 65.0 in | Wt 181.0 lb

## 2020-08-06 DIAGNOSIS — Z800 Family history of malignant neoplasm of digestive organs: Secondary | ICD-10-CM

## 2020-08-06 DIAGNOSIS — K635 Polyp of colon: Secondary | ICD-10-CM | POA: Diagnosis not present

## 2020-08-06 DIAGNOSIS — Z1211 Encounter for screening for malignant neoplasm of colon: Secondary | ICD-10-CM | POA: Diagnosis not present

## 2020-08-06 DIAGNOSIS — D120 Benign neoplasm of cecum: Secondary | ICD-10-CM

## 2020-08-06 MED ORDER — SODIUM CHLORIDE 0.9 % IV SOLN: 500.0000 mL | Freq: Once | INTRAVENOUS | Status: DC

## 2020-08-06 NOTE — Progress Notes (Addendum)
JB - Check-in  NS - VS  Pt's states no medical or surgical changes since previsit or office visit.  Pt's Mother has had a recurrance of colon cancer Dec 2021. Maw

## 2020-08-06 NOTE — Patient Instructions (Signed)
° ° °Handouts on polyps and hemorrhoids given to you today ° ° °Await pathology results on polyp removed  ° ° °YOU HAD AN ENDOSCOPIC PROCEDURE TODAY AT THE [facility]:   Refer to the procedure report that was given to you for any specific questions about what was found during the examination.  If the procedure report does not answer your questions, please call your gastroenterologist to clarify.  If you requested that your care partner not be given the details of your procedure findings, then the procedure report has been included in a sealed envelope for you to review at your convenience later. ° °YOU SHOULD EXPECT: Some feelings of bloating in the abdomen. Passage of more gas than usual.  Walking can help get rid of the air that was put into your GI tract during the procedure and reduce the bloating. If you had a lower endoscopy (such as a colonoscopy or flexible sigmoidoscopy) you may notice spotting of blood in your stool or on the toilet paper. If you underwent a bowel prep for your procedure, you may not have a normal bowel movement for a few days. ° °Please Note:  You might notice some irritation and congestion in your nose or some drainage.  This is from the oxygen used during your procedure.  There is no need for concern and it should clear up in a day or so. ° °SYMPTOMS TO REPORT IMMEDIATELY: ° °Following lower endoscopy (colonoscopy or flexible sigmoidoscopy): ° Excessive amounts of blood in the stool ° Significant tenderness or worsening of abdominal pains ° Swelling of the abdomen that is new, acute ° Fever of 100°F or higher ° ° °For urgent or emergent issues, a gastroenterologist can be reached at any hour by calling [phone]. °Do not use MyChart messaging for urgent concerns.  ° ° °DIET:  We do recommend a small meal at first, but then you may proceed to your regular diet.  Drink plenty of fluids but you should avoid alcoholic beverages for 24 hours. ° °ACTIVITY:  You should plan  to take it easy for the rest of today and you should NOT DRIVE or use heavy machinery until tomorrow (because of the sedation medicines used during the test).   ° °FOLLOW UP: °Our staff will call the number listed on your records 48-72 hours following your procedure to check on you and address any questions or concerns that you may have regarding the information given to you following your procedure. If we do not reach you, we will leave a message.  We will attempt to reach you two times.  During this call, we will ask if you have developed any symptoms of COVID 19. If you develop any symptoms (ie: fever, flu-like symptoms, shortness of breath, cough etc.) before then, please call [phone].  If you test positive for Covid 19 in the 2 weeks post procedure, please call and report this information to us.   ° °If any biopsies were taken you will be contacted by phone or by letter within the next 1-3 weeks.  Please call us at [phone] if you have not heard about the biopsies in 3 weeks.  ° ° °SIGNATURES/CONFIDENTIALITY: °You and/or your care partner have signed paperwork which will be entered into your electronic medical record.  These signatures attest to the fact that that the information above on your After Visit Summary has been reviewed and is understood.  Full responsibility of the confidentiality of this discharge information lies with you   care-partner.

## 2020-08-06 NOTE — Progress Notes (Signed)
Called to room to assist during endoscopic procedure.  Patient ID and intended procedure confirmed with present staff. Received instructions for my participation in the procedure from the performing physician.  

## 2020-08-06 NOTE — Op Note (Signed)
[facility] Patient Name: Carolyn Harvey Procedure Date: 08/06/2020 2:15 PM MRN: [id number] Endoscopist: Viviann Spare P. Adela Lank , MD Age: 45 Referring MD:  Date of Birth: [birth year] Gender: Female Account #: [id number] Procedure:                Colonoscopy Indications:              Screening in patient at increased risk: Family                            history of 1st-degree relative with colorectal                            cancer (mother diagnosed age 77s), This is the                            patient's first colonoscopy Medicines:                Monitored Anesthesia Care Procedure:                Pre-Anesthesia Assessment:                           - Prior to the procedure, a History and Physical                            was performed, and patient medications and                            allergies were reviewed. The patient's tolerance of                            previous anesthesia was also reviewed. The risks                            and benefits of the procedure and the sedation                            options and risks were discussed with the patient.                            All questions were answered, and informed consent                            was obtained. Prior Anticoagulants: The patient has                            taken no previous anticoagulant or antiplatelet                            agents. ASA Grade Assessment: II - A patient with                            mild systemic disease. After reviewing the risks  and benefits, the patient was deemed in                            satisfactory condition to undergo the procedure.                           After obtaining informed consent, the colonoscope                            was passed under direct vision. Throughout the                            procedure, the patient's blood pressure, pulse, and                            oxygen saturations were monitored  continuously. The                            Olympus [id number]) Colonoscope was                            introduced through the anus and advanced to the the                            cecum, identified by appendiceal orifice and                            ileocecal valve. The colonoscopy was performed                            without difficulty. The patient tolerated the                            procedure well. The quality of the bowel                            preparation was good. The ileocecal valve,                            appendiceal orifice, and rectum were photographed. Scope In: 2:23:04 PM Scope Out: 2:47:19 PM Scope Withdrawal Time: 0 hours 17 minutes 48 seconds  Total Procedure Duration: 0 hours 24 minutes 15 seconds  Findings:                 The perianal and digital rectal examinations were                            normal.                           A 4 to 5 mm polyp was found in the cecum. The polyp                            was flat. The polyp was removed with a cold snare.  Resection and retrieval were complete.                           The colon was tortuous.                           Internal hemorrhoids were found during                            retroflexion. The hemorrhoids were small.                           The exam was otherwise without abnormality. Complications:            No immediate complications. Estimated blood loss:                            Minimal. Estimated Blood Loss:     Estimated blood loss was minimal. Impression:               - One 4 to 5 mm polyp in the cecum, removed with a                            cold snare. Resected and retrieved.                           - Tortuous colon.                           - Internal hemorrhoids.                           - The examination was otherwise normal. Recommendation:           - Patient has a contact number available for                             emergencies. The signs and symptoms of potential                            delayed complications were discussed with the                            patient. Return to normal activities tomorrow.                            Written discharge instructions were provided to the                            patient.                           - Resume previous diet.                           - Continue present medications.                           -  Await pathology results with further                            recommendations Remo Lipps P. Jerrin Recore, MD 08/06/2020 2:51:30 PM This report has been signed electronically.

## 2020-08-06 NOTE — Progress Notes (Signed)
To PACU, VSS. Report to [email or web address] 

## 2020-08-08 ENCOUNTER — Telehealth: Payer: Self-pay | Admitting: *Deleted

## 2020-08-08 NOTE — Telephone Encounter (Signed)
1. Have you developed a fever since your procedure? no  2.   Have you had an respiratory symptoms (SOB or cough) since your procedure? no  3.   Have you tested positive for COVID 19 since your procedure no  4.   Have you had any family members/close contacts diagnosed with the COVID 19 since your procedure?  no   If yes to any of these questions please route to Laverna Peace, RN and Karlton Lemon, RN Follow up Call-  Call back number 08/06/2020  Post procedure Call Back phone  # #[phone] cell  Permission to leave phone message Yes  Some recent data might be hidden     Patient questions:  Do you have a fever, pain , or abdominal swelling? No. Pain Score  0 *  Have you tolerated food without any problems? Yes.    Have you been able to return to your normal activities? Yes.    Do you have any questions about your discharge instructions: Diet   No. Medications  No. Follow up visit  No.  Do you have questions or concerns about your Care? No.  Actions: * If pain score is 4 or above: No action needed, pain <4.

## 2020-08-12 ENCOUNTER — Encounter: Payer: Self-pay | Admitting: Physician Assistant

## 2020-08-12 MED ORDER — FLUOXETINE HCL 20 MG PO CAPS: 20.0000 mg | ORAL_CAPSULE | Freq: Every day | ORAL | 1 refills | Status: DC

## 2020-09-23 ENCOUNTER — Encounter: Payer: Self-pay | Admitting: Physician Assistant

## 2020-09-23 ENCOUNTER — Telehealth (INDEPENDENT_AMBULATORY_CARE_PROVIDER_SITE_OTHER): Payer: BC Managed Care – PPO | Admitting: Physician Assistant

## 2020-09-23 VITALS — Ht 65.0 in | Wt 175.0 lb

## 2020-09-23 DIAGNOSIS — I10 Essential (primary) hypertension: Secondary | ICD-10-CM | POA: Diagnosis not present

## 2020-09-23 DIAGNOSIS — F419 Anxiety disorder, unspecified: Secondary | ICD-10-CM

## 2020-09-23 DIAGNOSIS — F32A Depression, unspecified: Secondary | ICD-10-CM | POA: Diagnosis not present

## 2020-09-23 MED ORDER — FLUOXETINE HCL 10 MG PO CAPS: 10.0000 mg | ORAL_CAPSULE | Freq: Every day | ORAL | 0 refills | Status: DC

## 2020-09-23 NOTE — Progress Notes (Signed)
Virtual Visit via Video   I connected with Carolyn Harvey on 09/23/20 at  9:00 AM EST by a video enabled telemedicine application and verified that I am speaking with the correct person using two identifiers. Location patient: Home Location provider:  [facility], Office Persons participating in the virtual visit: Carolyn Harvey, [place] PA-C, Carolyn Pickler, LPN   I discussed the limitations of evaluation and management by telemedicine and the availability of in person appointments. The patient expressed understanding and agreed to proceed.  I acted as a Education administrator for [employer], [employer], LPN   Subjective:   HPI:   Anxiety/Depression Pt following up, currently taking Prozac 20 mg daily. Pt states medication has not been working as well the past month due to life issues, thinks she may need an increase in dosage. Pt states her mom colon cancer has come back and is back on Chemotherapy. Denies SI/HI.   Wt Readings from Last 5 Encounters:  09/23/20 175 lb (79.4 kg)  08/06/20 181 lb (82.1 kg)  07/23/20 181 lb (82.1 kg)  07/01/20 179 lb 12.8 oz (81.6 kg)  03/22/20 169 lb 6.1 oz (76.8 kg)    HTN Currently taking no medications. At home blood pressure readings are: not regularly checked. Patient denies chest pain, SOB, blurred vision, dizziness, unusual headaches, lower leg swelling. Denies excessive caffeine intake, stimulant usage, excessive alcohol intake, or increase in salt consumption.  BP Readings from Last 3 Encounters:  08/06/20 (!) 148/98  07/01/20 128/88  03/22/20 100/60     ROS: See pertinent positives and negatives per HPI.  Patient Active Problem List   Diagnosis Date Noted  . Anxiety and depression 03/22/2020  . Family history of colon cancer in mother 03/22/2020  . Hypothyroidism due to Hashimoto's thyroiditis 06/17/2018  . Hypertension 05/21/2017    Social History   Tobacco Use  . Smoking status: Never Smoker  . Smokeless  tobacco: Never Used  Substance Use Topics  . Alcohol use: Yes    Alcohol/week: 2.0 standard drinks    Types: 2 Cans of beer per week    Current Outpatient Medications:  .  cetirizine (ZYRTEC) 10 MG tablet, Take 1 tablet by mouth daily., Disp: , Rfl:  .  FLUoxetine (PROZAC) 10 MG capsule, Take 1 capsule (10 mg total) by mouth daily., Disp: 90 capsule, Rfl: 0 .  FLUoxetine (PROZAC) 20 MG capsule, Take 1 capsule (20 mg total) by mouth daily., Disp: 90 capsule, Rfl: 1 .  fluticasone (FLONASE) 50 MCG/ACT nasal spray, Place 2 sprays daily into both nostrils., Disp: 16 g, Rfl: 6 .  levonorgestrel (MIRENA) 20 MCG/24HR IUD, 1 each by Intrauterine route once., Disp: , Rfl:  .  levothyroxine (SYNTHROID) 175 MCG tablet, Take 1 tablet (175 mcg total) by mouth daily before breakfast., Disp: 90 tablet, Rfl: 3 .  Multiple Vitamin (MULTIVITAMIN) tablet, Take 1 tablet by mouth daily., Disp: , Rfl:   No Known Allergies  Objective:   VITALS: Per patient if applicable, see vitals. GENERAL: Alert, appears well and in no acute distress. HEENT: Atraumatic, conjunctiva clear, no obvious abnormalities on inspection of external nose and ears. NECK: Normal movements of the head and neck. CARDIOPULMONARY: No increased WOB. Speaking in clear sentences. I:E ratio WNL.  MS: Moves all visible extremities without noticeable abnormality. PSYCH: Pleasant and cooperative, well-groomed. Speech normal rate and rhythm. Affect is appropriate. Insight and judgement are appropriate. Attention is focused, linear, and appropriate.  NEURO: CN grossly intact. Oriented as arrived to  appointment on time with no prompting. Moves both UE equally.  SKIN: No obvious lesions, wounds, erythema, or cyanosis noted on face or hands.  Assessment and Plan:   [name] was seen today for anxiety and depression.  Diagnoses and all orders for this visit:  Anxiety and depression Uncontrolled. Increase prozac to 30 mg daily, she has just picked up  a 90 day supply of 20 mg prozac, I have sent in 10 mg prozac to add to this. Follow-up via mychart message if she would like to continue this.  Essential hypertension Suspect well controlled. Continue to monitor at doctors visits. Reviewed symptoms/signs of uncontrolled blood pressure and recommend close follow-up if this were to develop.  Other orders -     FLUoxetine (PROZAC) 10 MG capsule; Take 1 capsule (10 mg total) by mouth daily.    I discussed the assessment and treatment plan with the patient. The patient was provided an opportunity to ask questions and all were answered. The patient agreed with the plan and demonstrated an understanding of the instructions.   The patient was advised to call back or seek an in-person evaluation if the symptoms worsen or if the condition fails to improve as anticipated.   CMA or LPN served as scribe during this visit. History, Physical, and Plan performed by medical provider. The above documentation has been reviewed and is accurate and complete.  Carolyn Harvey, [place] 09/23/2020

## 2020-10-15 LAB — HM MAMMOGRAPHY

## 2020-10-18 ENCOUNTER — Other Ambulatory Visit: Payer: Self-pay | Admitting: Obstetrics & Gynecology

## 2020-10-18 DIAGNOSIS — R928 Other abnormal and inconclusive findings on diagnostic imaging of breast: Secondary | ICD-10-CM

## 2020-11-06 ENCOUNTER — Ambulatory Visit: Payer: BC Managed Care – PPO

## 2020-11-06 ENCOUNTER — Ambulatory Visit
Admission: RE | Admit: 2020-11-06 | Discharge: 2020-11-06 | Disposition: A | Discharge status: Home / Self Care | Payer: BC Managed Care – PPO | Source: Ambulatory Visit | Attending: Obstetrics & Gynecology | Admitting: Obstetrics & Gynecology

## 2020-11-06 ENCOUNTER — Other Ambulatory Visit: Payer: Self-pay

## 2020-11-06 DIAGNOSIS — R928 Other abnormal and inconclusive findings on diagnostic imaging of breast: Secondary | ICD-10-CM

## 2020-11-09 ENCOUNTER — Other Ambulatory Visit: Payer: Self-pay | Admitting: Internal Medicine

## 2020-11-12 ENCOUNTER — Encounter: Payer: Self-pay | Admitting: Physician Assistant

## 2020-12-14 ENCOUNTER — Other Ambulatory Visit: Payer: Self-pay | Admitting: Physician Assistant

## 2021-03-15 ENCOUNTER — Other Ambulatory Visit: Payer: Self-pay | Admitting: Physician Assistant

## 2021-03-17 ENCOUNTER — Encounter: Payer: Self-pay | Admitting: Physician Assistant

## 2021-03-17 MED ORDER — FLUOXETINE HCL 10 MG PO CAPS: 10.0000 mg | ORAL_CAPSULE | Freq: Every day | ORAL | 0 refills | Status: DC

## 2021-04-10 ENCOUNTER — Encounter: Payer: Self-pay | Admitting: Physician Assistant

## 2021-04-10 ENCOUNTER — Other Ambulatory Visit: Payer: Self-pay

## 2021-04-10 ENCOUNTER — Ambulatory Visit (INDEPENDENT_AMBULATORY_CARE_PROVIDER_SITE_OTHER): Payer: BC Managed Care – PPO | Admitting: Physician Assistant

## 2021-04-10 VITALS — BP 105/69 | HR 59 | Temp 98.6°F | Ht 65.0 in | Wt 215.4 lb

## 2021-04-10 DIAGNOSIS — Z0000 Encounter for general adult medical examination without abnormal findings: Secondary | ICD-10-CM

## 2021-04-10 DIAGNOSIS — E063 Autoimmune thyroiditis: Secondary | ICD-10-CM

## 2021-04-10 DIAGNOSIS — I10 Essential (primary) hypertension: Secondary | ICD-10-CM | POA: Diagnosis not present

## 2021-04-10 DIAGNOSIS — F32A Depression, unspecified: Secondary | ICD-10-CM

## 2021-04-10 DIAGNOSIS — F419 Anxiety disorder, unspecified: Secondary | ICD-10-CM

## 2021-04-10 DIAGNOSIS — E669 Obesity, unspecified: Secondary | ICD-10-CM | POA: Diagnosis not present

## 2021-04-10 DIAGNOSIS — E038 Other specified hypothyroidism: Secondary | ICD-10-CM

## 2021-04-10 LAB — COMPREHENSIVE METABOLIC PANEL
ALT: 18 U/L (ref 0–35)
AST: 22 U/L (ref 0–37)
Albumin: 4 g/dL (ref 3.5–5.2)
Alkaline Phosphatase: 37 U/L — ABNORMAL LOW (ref 39–117)
BUN: 11 mg/dL (ref 6–23)
CO2: 29 mEq/L (ref 19–32)
Calcium: 9.2 mg/dL (ref 8.4–10.5)
Chloride: 102 mEq/L (ref 96–112)
Creatinine, Ser: 0.78 mg/dL (ref 0.40–1.20)
GFR: 91.53 mL/min (ref >60.00)
Glucose, Bld: 73 mg/dL (ref 70–99)
Potassium: 4.6 mEq/L (ref 3.5–5.1)
Sodium: 136 mEq/L (ref 135–145)
Total Bilirubin: 1 mg/dL (ref 0.2–1.2)
Total Protein: 6.5 g/dL (ref 6.0–8.3)

## 2021-04-10 LAB — CBC WITH DIFFERENTIAL/PLATELET
Basophils Absolute: 0 10*3/uL (ref 0.0–0.1)
Basophils Relative: 0.6 % (ref 0.0–3.0)
Eosinophils Absolute: 0.2 10*3/uL (ref 0.0–0.7)
Eosinophils Relative: 4.6 % (ref 0.0–5.0)
HCT: 40.1 % (ref 36.0–46.0)
Hemoglobin: 13.4 g/dL (ref 12.0–15.0)
Lymphocytes Relative: 22 % (ref 12.0–46.0)
Lymphs Abs: 1 10*3/uL (ref 0.7–4.0)
MCHC: 33.4 g/dL (ref 30.0–36.0)
MCV: 95.2 fl (ref 78.0–100.0)
Monocytes Absolute: 0.5 10*3/uL (ref 0.1–1.0)
Monocytes Relative: 12.2 % — ABNORMAL HIGH (ref 3.0–12.0)
Neutro Abs: 2.6 10*3/uL (ref 1.4–7.7)
Neutrophils Relative %: 60.6 % (ref 43.0–77.0)
Platelets: 259 10*3/uL (ref 150.0–400.0)
RBC: 4.21 Mil/uL (ref 3.87–5.11)
RDW: 13 % (ref 11.5–15.5)
WBC: 4.3 10*3/uL (ref 4.0–10.5)

## 2021-04-10 LAB — LIPID PANEL
Cholesterol: 145 mg/dL (ref 0–200)
HDL: 41.3 mg/dL (ref >39.00)
LDL Cholesterol: 87 mg/dL (ref 0–99)
NonHDL: 103.55
Total CHOL/HDL Ratio: 4
Triglycerides: 85 mg/dL (ref 0.0–149.0)
VLDL: 17 mg/dL (ref 0.0–40.0)

## 2021-04-10 LAB — HEMOGLOBIN A1C: Hgb A1c MFr Bld: 5.3 % (ref 4.6–6.5)

## 2021-04-10 MED ORDER — FLUOXETINE HCL 20 MG PO CAPS: 20.0000 mg | ORAL_CAPSULE | Freq: Every day | ORAL | 3 refills | Status: DC

## 2021-04-10 MED ORDER — FLUOXETINE HCL 10 MG PO CAPS: 10.0000 mg | ORAL_CAPSULE | Freq: Every day | ORAL | 3 refills | Status: DC

## 2021-04-10 NOTE — Patient Instructions (Signed)
It was great to see you! ? ?Please go to the lab for blood work.  ? ?Our office will call you with your results unless you have chosen to receive results via MyChart. ? ?If your blood work is normal we will follow-up each year for physicals and as scheduled for chronic medical problems. ? ?If anything is abnormal we will treat accordingly and get you in for a follow-up. ? ?Take care, ? ?Berneice Zettlemoyer ?  ? ? ?

## 2021-04-10 NOTE — Progress Notes (Signed)
Subjective:    Carolyn Harvey Harvey is a 45 y.o. female and is here for a comprehensive physical exam.   HPI  Health Maintenance Due  Topic Date Due   Hepatitis C Screening  Never done   COVID-19 Vaccine (3 - Booster for Carolyn Harvey series) 03/27/2020   INFLUENZA VACCINE  03/03/2021   Social: Today patient reports she is doing very well. Mom finished second round of chemotherapy- everything went well and her provider is keeping her on a low dose chemo pill. 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  No acute concerns addressed today.  Chronic Issues:  Anxiety/Depression: Patient is currently taking Prozac 30 mg daily - in the past she stated this did not work for her in the past due to life issues and considered wanting to increase the dosage. In the last visit she stated her mom's colon cancer had came back and was placed back on Chemotherapy.    Today she reports that Prozac 30 mg is working really well for her - she would like to eventually decrease back down to 10 mg in the near future. Reports no adverse side effects while taking this medication  Hypothyroidism: follows with Dr. Cruzita Harvey for this. Patient is taking levothyroxine 175 mcg daily. She has an appointment soon whit Dr. Cruzita Harvey in early November.  Obesity: patient reports that she has had some weight gain. 2 months ago she purchased a pelaton and has used it every day since buying it -she really enjoys it.  HTN Currently not on any medications. At home blood pressure readings are: not taking at home. Patient denies chest pain, SOB, blurred vision, dizziness, unusual headaches, lower leg swelling. She had a coffee this morning but denies stimulant usage, excessive alcohol intake, or increase in salt consumption.  BP Readings from Last 3 Encounters:  04/10/21 105/69  08/06/20 (!) 148/98  07/01/20 128/88    Health Maintenance: Immunizations -- 3rd  COVID-19 - received the new variant strand vaccination on Sunday and felt fine despite being tired and Flu vaccination - received this on Sunday. Colonoscopy -- 08/06/20 with a 10-year recall Mammogram -- 10/15/20 and yearly PAP -- 07/04/19 with a 5-year recall Bone Density -- n/a Diet --  reasonably healthy eating habits - not being in her office has been helpful for her. Sleep habits -- n/a Exercise -- bought a pelaton 2 months ago and uses daily, lots of strength- 3 days per week and does have free weights at home, power zone training- 3 days per week, and yoga once per week Weight -- reports losing a lot of inches - today 215 lbs Mood -- happy and normal in mood. Dermatology-- She does not see dermatology and has no concerns - patient uses sunscreen consistently. Ophthalmology-- She does follow with her ophthalmologist for her contacts. Weight history: Wt Readings from Last 10 Encounters:  04/10/21 215 lb 6.4 oz (97.7 kg)  09/23/20 175 lb (79.4 kg)  08/06/20 181 lb (82.1 kg)  07/23/20 181 lb (82.1 kg)  07/01/20 179 lb 12.8 oz (81.6 kg)  03/22/20 169 lb 6.1 oz (76.8 kg)  06/20/19 192 lb (87.1 kg)  04/20/19 204 lb (92.5 kg)  08/24/18 262 lb 6.1 oz (119 kg)  06/17/18 265 lb (120.2 kg)   Body mass index is 35.84 kg/m. No LMP recorded. (Menstrual status: IUD). Alcohol use:  reports current alcohol use of about 2.0 standard drinks per week.  Tobacco use:  Tobacco Use: Low Risk    Smoking Tobacco Use: Never   Smokeless Tobacco Use: Never     Depression screen [facility] 2/9 09/23/2020  Decreased Interest 0  Down, Depressed, Hopeless 0  PHQ - 2 Score 0  Altered sleeping 1  Tired, decreased energy 1  Change in appetite 1  Feeling bad or failure about yourself  0  Trouble concentrating 1  Moving slowly or fidgety/restless 0  Suicidal thoughts 0  PHQ-9 Score 4  Difficult doing work/chores Not difficult at all     Other providers/specialists: Patient Care Team: Carolyn Harvey Harvey,  [place] as PCP - General (Physician Assistant)    PMHx, SurgHx, SocialHx, Medications, and Allergies were reviewed in the Visit Navigator and updated as appropriate.   Past Medical History:  Diagnosis Date   Allergy    Depression    Hypertension    07/23/20 lost 100# and no longer needs meds x 6 months   Thyroid disease      Past Surgical History:  Procedure Laterality Date   CHOLECYSTECTOMY     TONSILLECTOMY     WISDOM TOOTH EXTRACTION       Family History  Problem Relation Age of Onset   Asthma Mother    Miscarriages / Stillbirths Mother    Hypothyroidism Mother    Colon cancer Mother 14   Colon polyps Mother    Diabetes Father    Hypertension Father    Pulmonary embolism Father    Miscarriages / Stillbirths Sister    Fibromyalgia Sister    Hypothyroidism Sister    CVA Maternal Grandfather    Alzheimer's disease Paternal Grandmother    Stroke Paternal Grandfather    Stomach cancer Paternal Uncle    Breast cancer Neg Hx    Esophageal cancer Neg Hx    Rectal cancer Neg Hx     Social History   Tobacco Use   Smoking status: Never   Smokeless tobacco: Never  Vaping Use   Vaping Use: Never used  Substance Use Topics   Alcohol use: Yes    Alcohol/week: 2.0 standard drinks    Types: 2 Cans of beer per week   Drug use: No    Review of Systems:   Review of Systems  Constitutional:  Negative for chills, fever, malaise/fatigue and weight loss.  HENT:  Negative for hearing loss, sinus pain and sore throat.   Respiratory:  Negative for cough and hemoptysis.   Cardiovascular:  Negative for chest pain, palpitations, leg swelling and PND.  Gastrointestinal:  Negative for abdominal pain, constipation, diarrhea, heartburn, nausea and vomiting.  Genitourinary:  Negative for dysuria, frequency and urgency.  Musculoskeletal:  Negative for back pain, myalgias and neck pain.  Skin:  Negative for itching and rash.  Neurological:  Negative for dizziness, tingling, seizures  and headaches.  Endo/Heme/Allergies:  Negative for polydipsia.  Psychiatric/Behavioral:  Negative for depression. The patient is not nervous/anxious.    Objective:   BP 105/69   Pulse (!) 59   Temp 98.6 F (37 C) (Temporal)   Ht '5\' 5"'$  (1.651 m)   Wt 215 lb 6.4 oz (97.7 kg)   SpO2 98%   BMI 35.84 kg/m  Body mass index is 35.84 kg/m.   General Appearance:    Alert, cooperative, no distress, appears stated age  Head:    Normocephalic, without obvious abnormality, atraumatic  Eyes:    PERRL, conjunctiva/corneas clear, EOM's intact, fundi    benign, both eyes  Ears:  Normal TM's and external ear canals, both ears  Nose:   Nares normal, septum midline, mucosa normal, no drainage    or sinus tenderness  Throat:   Lips, mucosa, and tongue normal; teeth and gums normal  Neck:   Supple, symmetrical, trachea midline, no adenopathy;    thyroid:  no enlargement/tenderness/nodules; no carotid   bruit or JVD  Back:     Symmetric, no curvature, ROM normal, no CVA tenderness  Lungs:     Clear to auscultation bilaterally, respirations unlabored  Chest Wall:    No tenderness or deformity   Heart:    Regular rate and rhythm, S1 and S2 normal, no murmur, rub or gallop  Breast Exam:    Deferred  Abdomen:     Soft, non-tender, bowel sounds active all four quadrants,    no masses, no organomegaly  Genitalia:    Deferred  Extremities:   Extremities normal, atraumatic, no cyanosis or edema  Pulses:   2+ and symmetric all extremities  Skin:   Skin color, texture, turgor normal, no rashes or lesions  Lymph nodes:   Cervical, supraclavicular, and axillary nodes normal  Neurologic:   CNII-XII intact, normal strength, sensation and reflexes    throughout    Assessment/Plan:   Routine physical examination Today patient counseled on age appropriate routine health concerns for screening and prevention, each reviewed and up to date or declined. Immunizations reviewed and up to date or declined. Labs  ordered and reviewed. Risk factors for depression reviewed and negative. Hearing function and visual acuity are intact. ADLs screened and addressed as needed. Functional ability and level of safety reviewed and appropriate. Education, counseling and referrals performed based on assessed risks today. Patient provided with a copy of personalized plan for preventive services.  Anxiety and depression Well controlled Continue prozac 30 mg daily Follow-up in 6 months, sooner if concerns I discussed with patient that if they develop any SI, to tell someone immediately and seek medical attention.   Essential hypertension Normotensive without medications Continue to monitor  Hypothryoidism due to Hashimoto's thyroiditis Well controlled  Obesity, unspecified classification, unspecified obesity type, unspecified whether serious comorbidity present Continue to work on healthy diet and exercise  Patient Counseling: '[x]'$    Nutrition: Stressed importance of moderation in sodium/caffeine intake, saturated fat and cholesterol, caloric balance, sufficient intake of fresh fruits, vegetables, fiber, calcium, iron, and 1 mg of folate supplement per day (for females capable of pregnancy).  '[x]'$    Stressed the importance of regular exercise.   '[x]'$    Substance Abuse: Discussed cessation/primary prevention of tobacco, alcohol, or other drug use; driving or other dangerous activities under the influence; availability of treatment for abuse.   '[x]'$    Injury prevention: Discussed safety belts, safety helmets, smoke detector, smoking near bedding or upholstery.   '[x]'$    Sexuality: Discussed sexually transmitted diseases, partner selection, use of condoms, avoidance of unintended pregnancy  and contraceptive alternatives.  '[x]'$    Dental health: Discussed importance of regular tooth brushing, flossing, and dental visits.  '[x]'$    Health maintenance and immunizations reviewed. Please refer to Health maintenance section.     I,Harris Phan,acting as a Education administrator for [employer], PA.,have documented all relevant documentation on the behalf of Carolyn Coke, PA,as directed by  Carolyn Coke, PA while in the presence of Carolyn Harvey Harvey, [place].  I, Carolyn Harvey Harvey, [place], have reviewed all documentation for this visit. The documentation on 04/10/21 for the exam, diagnosis, procedures, and orders are all accurate and complete.  Carolyn Coke, PA-C [place]

## 2021-05-13 ENCOUNTER — Other Ambulatory Visit: Payer: Self-pay | Admitting: Internal Medicine

## 2021-07-01 NOTE — Progress Notes (Signed)
Patient ID: Carolyn Harvey, female   DOB: Aug 12, 1975, 45 y.o.   MRN: [id number]   This visit occurred during the SARS-CoV-2 public health emergency.  Safety protocols were in place, including screening questions prior to the visit, additional usage of staff PPE, and extensive cleaning of exam room while observing appropriate contact time as indicated for disinfecting solutions.   HPI  Carolyn Harvey is a 45 y.o.-year-old female, returning for f/u for hypothyroidism due to Hashimoto's thyroiditis.  Last visit 1 year ago.  Interim history: In 2020, patient lost a significant amount of weight, almost 75 pounds, after starting to work at home and cooking most of her meals.  She was also active, doing yoga.  Afterwards, she returned to work in person and she gained a significant amount of weight back.  In fact, since last visit, she gained approximately 50 pounds back. She had the flu 2 weeks ago. She denies constipation, cold intolerance, hair loss.  She has some fatigue.  She is working long hours as they are short staffed at work.  Reviewed history: She has been dx with hypothyroidism in early 1990s >> on generic levothyroxine.  Previously on Synthroid d.a.w.  She is on levothyroxine 175 mcg daily: - in am - fasting - at least 30 min from b'fast - no calcium - no iron - + multivitamins at night - no PPIs - not on Biotin  Reviewed her TFTs: Lab Results  Component Value Date   TSH 3.67 07/01/2020   TSH 3.67 11/21/2019   TSH 0.08 (L) 06/20/2019   TSH 0.93 06/17/2018   TSH 7.05 (H) 09/29/2017   TSH 4.63 (H) 06/29/2017   FREET4 1.02 07/01/2020   FREET4 1.23 11/21/2019   FREET4 1.37 06/20/2019   FREET4 1.23 06/17/2018   FREET4 1.03 09/29/2017   FREET4 1.34 06/29/2017    Her TPO antibodies were positive in the past: Component     Latest Ref Rng & Units 09/29/2017  Thyroglobulin Ab     < or = 1 IU/mL 190 (H)  Thyroperoxidase Ab SerPl-aCnc     <9 IU/mL 6   Pt denies: - feeling  nodules in neck - hoarseness - dysphagia - choking - SOB with lying down  She has positive family history of thyroid disorders: M - hyperthyroidism -  Had to have RAI tx; 2 sisters. + sister with RA.   + FH of thyroid cancer in first cousin. No h/o radiation tx to head or neck.  No herbal supplements. No Biotin use. No recent steroids use.    She has a history of HTN.  On losartan. She started Fluoxetine.  Mother has colon cancer.  ROS: + see HPI  I reviewed pt's medications, allergies, PMH, social hx, family hx, and changes were documented in the history of present illness. Otherwise, unchanged from my initial visit note.   Past Medical History:  Diagnosis Date   Allergy    Depression    Hypertension    07/23/20 lost 100# and no longer needs meds x 6 months   Thyroid disease    Past Surgical History:  Procedure Laterality Date   CHOLECYSTECTOMY     TONSILLECTOMY     WISDOM TOOTH EXTRACTION     Social History   Socioeconomic History   Marital status: Single    Spouse name: Not on file   Number of children: 0  Occupational History   librarian  Tobacco Use   Smoking status: Never Smoker   Smokeless tobacco: Never Used  Substance and Sexual Activity   Alcohol use: Yes, beer, 1-2x a week   Drug use: No   Sexual activity: Yes  Other Topics Concern   Not on file  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     FLUoxetine (PROZAC) 10 MG capsule Take 1 capsule (10 mg total) by mouth daily. 90 capsule 3   FLUoxetine (PROZAC) 20 MG capsule Take 1 capsule (20 mg total) by mouth daily. 90 capsule 3   fluticasone (FLONASE) 50 MCG/ACT nasal spray Place 2 sprays daily into both nostrils. 16 g 6   levonorgestrel (MIRENA) 20 MCG/24HR IUD 1 each by Intrauterine  route once.     levothyroxine (SYNTHROID) 175 MCG tablet TAKE ONE TABLET BY MOUTH DAILY BEFORE BREAKFAST 90 tablet 1   Multiple Vitamin (MULTIVITAMIN) tablet Take 1 tablet by mouth daily.     No current facility-administered medications on file prior to visit.   No Known Allergies Family History  Problem Relation Age of Onset   Asthma Mother    Miscarriages / Stillbirths Mother    Hypothyroidism Mother    Colon cancer Mother 62   Colon polyps Mother    Diabetes Father    Hypertension Father    Pulmonary embolism Father    Miscarriages / Stillbirths Sister    Fibromyalgia Sister    Hypothyroidism Sister    CVA Maternal Grandfather    Alzheimer's disease Paternal Grandmother    Stroke Paternal Grandfather    Stomach cancer Paternal Uncle    Breast cancer Neg Hx    Esophageal cancer Neg Hx    Rectal cancer Neg Hx     PE: BP 126/70 (BP Location: Left Arm, Patient Position: Sitting, Cuff Size: Large)   Pulse 74   Ht 5\' 5"  (1.651 m)   Wt 226 lb 6.4 oz (102.7 kg)   SpO2 98%   BMI 37.67 kg/m  Wt Readings from Last 3 Encounters:  07/02/21 226 lb 6.4 oz (102.7 kg)  04/10/21 215 lb 6.4 oz (97.7 kg)  09/23/20 175 lb (79.4 kg)   Constitutional: Slightly overweight, in NAD Eyes: PERRLA, EOMI, no exophthalmos ENT: moist mucous membranes, no thyromegaly, no cervical lymphadenopathy Cardiovascular: RRR, No MRG, + left lower extremity edema Respiratory: CTA B Musculoskeletal: no deformities, strength intact in all 4 Skin: moist, warm, no rashes Neurological: no tremor with outstretched hands, DTR normal in all 4  ASSESSMENT: 1. Hypothyroidism due to Hashimoto's thyroiditis  2.  Weight gain  PLAN:  1. Patient with longstanding hypothyroidism, diagnosed in high school, previously on high-dose levothyroxine therapy (225 mcg daily), which we were able to decrease after she lost a significant amount of weight (73 lbs in 1 year).  Before last visit, she lost another 13 pounds.   However, since then, she gained ~47-50 lbs in last year!! -We diagnosed Hashimoto's thyroiditis based on elevated antithyroid antibodies.  - latest thyroid labs reviewed with pt. >> normal: Lab Results  Component Value Date   TSH 3.67 07/01/2020  - she continues on LT4 175 mcg daily - pt feels good on this dose, but gained a signif. Amount of weight as mentioned above. - we discussed about taking the thyroid hormone every day, with water, >30 minutes before breakfast, separated by >4 hours  from acid reflux medications, calcium, iron, multivitamins. Pt. is taking it correctly. - will check thyroid tests today: TSH and fT4 - If labs are abnormal, she will need to return for repeat TFTs in 1.5 months - OTW, RTC in 1 year  2.  Weight gain -Obesity class II -She gained almost 50 pounds since last visit -At this visit, she tells me that since she returned to work she does not have time to exercise as much as before.  In the past, she would exercise 1 hour almost every day.  Now she manages 45 minutes on her Peloton approximately 3-4 times a week.  She did not change her diet since last visit.  We discussed about different aspects of the diet that she may need to change.  For both weight loss and especially for Hashimoto's thyroiditis, I advised her to try to eliminate dairy.  Also, reduce gluten and meat, since these are both gut irritants.  Advised him to include plenty of antioxidants in the form of vegetables and low glycemic index fruit.  We also discussed about the importance of not having liquids with a meal and also the importance of the order of the foods for a particular meal, always start with fiber, protein/fat and into his carbs, rather than starting with carbs, to avoid a high insulin peak and therefore weight gain. -She is interested to talk with a nutritionist and tells me that her work covers this.  I encouraged her to schedule this appointment. -We also discussed that weight gain may  influence her levothyroxine requirement so I do expect that we may need a higher dose of levothyroxine after the results come back.  Component     Latest Ref Rng & Units 07/02/2021  T4,Free(Direct)     0.60 - 1.60 ng/dL 0.95  TSH     0.35 - 5.50 uIU/mL 9.93 (H)  TSH is high.  Since she mentions that she is taking the levothyroxine correctly, will need to increase the dose even further, to 200 mcg daily.  I plan to repeat her test in 1.5 months.  Philemon Kingdom, MD PhD [facility]

## 2021-07-02 ENCOUNTER — Encounter: Payer: Self-pay | Admitting: Internal Medicine

## 2021-07-02 ENCOUNTER — Ambulatory Visit: Payer: BC Managed Care – PPO | Admitting: Internal Medicine

## 2021-07-02 ENCOUNTER — Other Ambulatory Visit: Payer: Self-pay

## 2021-07-02 VITALS — BP 126/70 | HR 74 | Ht 65.0 in | Wt 226.4 lb

## 2021-07-02 DIAGNOSIS — E063 Autoimmune thyroiditis: Secondary | ICD-10-CM

## 2021-07-02 DIAGNOSIS — E038 Other specified hypothyroidism: Secondary | ICD-10-CM

## 2021-07-02 DIAGNOSIS — R635 Abnormal weight gain: Secondary | ICD-10-CM

## 2021-07-02 LAB — TSH: TSH: 9.93 u[IU]/mL — ABNORMAL HIGH (ref 0.35–5.50)

## 2021-07-02 LAB — T4, FREE: Free T4: 0.95 ng/dL (ref 0.60–1.60)

## 2021-07-02 NOTE — Patient Instructions (Signed)
Please stop at the lab.  Please  continue Levothyroxine 175 mcg daily.  Take the thyroid hormone every day, with water, at least 30 minutes before breakfast, separated by at least 4 hours from: - acid reflux medications - calcium - iron - multivitamins  Please come back for a follow-up appointment in 1 year. 

## 2021-07-03 MED ORDER — LEVOTHYROXINE SODIUM 200 MCG PO TABS
200.0000 ug | ORAL_TABLET | Freq: Every day | ORAL | 3 refills | Status: DC
Start: 2021-07-03 — End: 2021-12-30

## 2021-08-15 ENCOUNTER — Other Ambulatory Visit (INDEPENDENT_AMBULATORY_CARE_PROVIDER_SITE_OTHER): Payer: BC Managed Care – PPO

## 2021-08-15 ENCOUNTER — Other Ambulatory Visit: Payer: Self-pay

## 2021-08-15 DIAGNOSIS — E063 Autoimmune thyroiditis: Secondary | ICD-10-CM | POA: Diagnosis not present

## 2021-08-15 DIAGNOSIS — E038 Other specified hypothyroidism: Secondary | ICD-10-CM

## 2021-08-15 LAB — TSH: TSH: 1.67 u[IU]/mL (ref 0.35–5.50)

## 2021-08-15 LAB — T4, FREE: Free T4: 1.43 ng/dL (ref 0.60–1.60)

## 2021-12-19 LAB — HM MAMMOGRAPHY

## 2021-12-28 ENCOUNTER — Other Ambulatory Visit: Payer: Self-pay | Admitting: Internal Medicine

## 2022-04-13 ENCOUNTER — Ambulatory Visit (INDEPENDENT_AMBULATORY_CARE_PROVIDER_SITE_OTHER): Payer: BC Managed Care – PPO | Admitting: Physician Assistant

## 2022-04-13 ENCOUNTER — Encounter: Payer: Self-pay | Admitting: Physician Assistant

## 2022-04-13 VITALS — BP 128/76 | HR 62 | Ht 66.0 in | Wt 226.4 lb

## 2022-04-13 DIAGNOSIS — E038 Other specified hypothyroidism: Secondary | ICD-10-CM | POA: Diagnosis not present

## 2022-04-13 DIAGNOSIS — F32A Depression, unspecified: Secondary | ICD-10-CM | POA: Diagnosis not present

## 2022-04-13 DIAGNOSIS — E063 Autoimmune thyroiditis: Secondary | ICD-10-CM

## 2022-04-13 DIAGNOSIS — F419 Anxiety disorder, unspecified: Secondary | ICD-10-CM

## 2022-04-13 DIAGNOSIS — E669 Obesity, unspecified: Secondary | ICD-10-CM

## 2022-04-13 DIAGNOSIS — Z0000 Encounter for general adult medical examination without abnormal findings: Secondary | ICD-10-CM | POA: Diagnosis not present

## 2022-04-13 LAB — COMPREHENSIVE METABOLIC PANEL
ALT: 15 U/L (ref 0–35)
AST: 19 U/L (ref 0–37)
Albumin: 3.6 g/dL (ref 3.5–5.2)
Alkaline Phosphatase: 46 U/L (ref 39–117)
BUN: 14 mg/dL (ref 6–23)
CO2: 32 mEq/L (ref 19–32)
Calcium: 9 mg/dL (ref 8.4–10.5)
Chloride: 104 mEq/L (ref 96–112)
Creatinine, Ser: 0.8 mg/dL (ref 0.40–1.20)
GFR: 88.17 mL/min (ref >60.00)
Glucose, Bld: 79 mg/dL (ref 70–99)
Potassium: 4.9 mEq/L (ref 3.5–5.1)
Sodium: 140 mEq/L (ref 135–145)
Total Bilirubin: 0.7 mg/dL (ref 0.2–1.2)
Total Protein: 6.2 g/dL (ref 6.0–8.3)

## 2022-04-13 LAB — CBC WITH DIFFERENTIAL/PLATELET
Basophils Absolute: 0 10*3/uL (ref 0.0–0.1)
Basophils Relative: 0.9 % (ref 0.0–3.0)
Eosinophils Absolute: 0.2 10*3/uL (ref 0.0–0.7)
Eosinophils Relative: 3.8 % (ref 0.0–5.0)
HCT: 39.1 % (ref 36.0–46.0)
Hemoglobin: 13.1 g/dL (ref 12.0–15.0)
Lymphocytes Relative: 20.5 % (ref 12.0–46.0)
Lymphs Abs: 0.9 10*3/uL (ref 0.7–4.0)
MCHC: 33.4 g/dL (ref 30.0–36.0)
MCV: 93.8 fl (ref 78.0–100.0)
Monocytes Absolute: 0.4 10*3/uL (ref 0.1–1.0)
Monocytes Relative: 9.6 % (ref 3.0–12.0)
Neutro Abs: 3 10*3/uL (ref 1.4–7.7)
Neutrophils Relative %: 65.2 % (ref 43.0–77.0)
Platelets: 262 10*3/uL (ref 150.0–400.0)
RBC: 4.17 Mil/uL (ref 3.87–5.11)
RDW: 12.9 % (ref 11.5–15.5)
WBC: 4.6 10*3/uL (ref 4.0–10.5)

## 2022-04-13 LAB — LIPID PANEL
Cholesterol: 147 mg/dL (ref 0–200)
HDL: 42.4 mg/dL (ref >39.00)
LDL Cholesterol: 90 mg/dL (ref 0–99)
NonHDL: 104.92
Total CHOL/HDL Ratio: 3
Triglycerides: 75 mg/dL (ref 0.0–149.0)
VLDL: 15 mg/dL (ref 0.0–40.0)

## 2022-04-13 NOTE — Progress Notes (Signed)
Subjective:    Carolyn Harvey is a 46 y.o. female and is here for a comprehensive physical exam.   HPI  Health Maintenance Due  Topic Date Due   Hepatitis C Screening  Never done   COVID-19 Vaccine (3 - Pfizer series) 06/01/2021   MAMMOGRAM  10/15/2021   INFLUENZA VACCINE  03/03/2022   Acute Concerns: None  Chronic Issues: Hypothyroidism -- currently well controlled with levothyroxine 200 mcg daily. Seeing endo soon.  Anxiety and depression -- currently taking prozac 30 mg daily. Tolerating well. Denies SI/HI.  Health Maintenance: Immunizations -- UTD Colonoscopy -- UTD Mammogram -- [facility] -- UTD -- we are requesting records PAP -- UTD Diet -- overall tries to avoid gluten Sleep habits -- no concerns Exercise -- working on trying to get into exercise Weight -- Weight: 226 lb 6.4 oz (102.7 kg)  Mood -- no major concerns Weight history: Wt Readings from Last 10 Encounters:  04/13/22 226 lb 6.4 oz (102.7 kg)  07/02/21 226 lb 6.4 oz (102.7 kg)  04/10/21 215 lb 6.4 oz (97.7 kg)  09/23/20 175 lb (79.4 kg)  08/06/20 181 lb (82.1 kg)  07/23/20 181 lb (82.1 kg)  07/01/20 179 lb 12.8 oz (81.6 kg)  03/22/20 169 lb 6.1 oz (76.8 kg)  06/20/19 192 lb (87.1 kg)  04/20/19 204 lb (92.5 kg)   Body mass index is 36.54 kg/m. No LMP recorded. (Menstrual status: IUD). Alcohol use:  reports current alcohol use of about 2.0 standard drinks of alcohol per week. Tobacco use:  Tobacco Use: Low Risk  (04/13/2022)   Patient History    Smoking Tobacco Use: Never    Smokeless Tobacco Use: Never    Passive Exposure: Not on file        04/13/2022    9:03 AM  Depression screen PHQ 2/9  Decreased Interest 0  Down, Depressed, Hopeless 0  PHQ - 2 Score 0     Other providers/specialists: Patient Care Team: Inda Coke, [place] as PCP - General (Physician Assistant)    PMHx, SurgHx, SocialHx, Medications, and Allergies were reviewed in the Visit Navigator and updated  as appropriate.   Past Medical History:  Diagnosis Date   Allergy    Depression    Hypertension    07/23/20 lost 100# and no longer needs meds x 6 months   Thyroid disease      Past Surgical History:  Procedure Laterality Date   CHOLECYSTECTOMY     TONSILLECTOMY     WISDOM TOOTH EXTRACTION       Family History  Problem Relation Age of Onset   Asthma Mother    Miscarriages / Stillbirths Mother    Hypothyroidism Mother    Colon cancer Mother 54   Colon polyps Mother    Diabetes Father    Hypertension Father    Pulmonary embolism Father    Miscarriages / Stillbirths Sister    Fibromyalgia Sister    Hypothyroidism Sister    CVA Maternal Grandfather    Alzheimer's disease Paternal Grandmother    Stroke Paternal Grandfather    Stomach cancer Paternal Uncle    Breast cancer Neg Hx    Esophageal cancer Neg Hx    Rectal cancer Neg Hx     Social History   Tobacco Use   Smoking status: Never   Smokeless tobacco: Never  Vaping Use   Vaping Use: Never used  Substance Use Topics   Alcohol use: Yes    Alcohol/week: 2.0 standard drinks of  alcohol    Types: 2 Cans of beer per week   Drug use: No    Review of Systems:   Review of Systems  Constitutional:  Negative for chills, fever, malaise/fatigue and weight loss.  HENT:  Negative for hearing loss, sinus pain and sore throat.   Respiratory:  Negative for cough and hemoptysis.   Cardiovascular:  Negative for chest pain, palpitations, leg swelling and PND.  Gastrointestinal:  Negative for abdominal pain, constipation, diarrhea, heartburn, nausea and vomiting.  Genitourinary:  Negative for dysuria, frequency and urgency.  Musculoskeletal:  Negative for back pain, myalgias and neck pain.  Skin:  Negative for itching and rash.  Neurological:  Negative for dizziness, tingling, seizures and headaches.  Endo/Heme/Allergies:  Negative for polydipsia.  Psychiatric/Behavioral:  Negative for depression. The patient is not  nervous/anxious.     Objective:   BP 128/76   Pulse 62   Ht '5\' 6"'$  (1.676 m)   Wt 226 lb 6.4 oz (102.7 kg)   SpO2 96%   BMI 36.54 kg/m  Body mass index is 36.54 kg/m.   General Appearance:    Alert, cooperative, no distress, appears stated age  Head:    Normocephalic, without obvious abnormality, atraumatic  Eyes:    PERRL, conjunctiva/corneas clear, EOM's intact, fundi    benign, both eyes  Ears:    Normal TM's and external ear canals, both ears  Nose:   Nares normal, septum midline, mucosa normal, no drainage    or sinus tenderness  Throat:   Lips, mucosa, and tongue normal; teeth and gums normal  Neck:   Supple, symmetrical, trachea midline, no adenopathy;    thyroid:  no enlargement/tenderness/nodules; no carotid   bruit or JVD  Back:     Symmetric, no curvature, ROM normal, no CVA tenderness  Lungs:     Clear to auscultation bilaterally, respirations unlabored  Chest Wall:    No tenderness or deformity   Heart:    Regular rate and rhythm, S1 and S2 normal, no murmur, rub or gallop  Breast Exam:    Deferred  Abdomen:     Soft, non-tender, bowel sounds active all four quadrants,    no masses, no organomegaly  Genitalia:    Deferred  Extremities:   Extremities normal, atraumatic, no cyanosis or edema  Pulses:   2+ and symmetric all extremities  Skin:   Skin color, texture, turgor normal, no rashes or lesions  Lymph nodes:   Cervical, supraclavicular, and axillary nodes normal  Neurologic:   CNII-XII intact, normal strength, sensation and reflexes    throughout    Assessment/Plan:   Routine physical examination Today patient counseled on age appropriate routine health concerns for screening and prevention, each reviewed and up to date or declined. Immunizations reviewed and up to date or declined. Labs ordered and reviewed. Risk factors for depression reviewed and negative. Hearing function and visual acuity are intact. ADLs screened and addressed as needed. Functional  ability and level of safety reviewed and appropriate. Education, counseling and referrals performed based on assessed risks today. Patient provided with a copy of personalized plan for preventive services.  Anxiety and depression No red flags Stable on prozac 30 mg daily Follow-up in 1 year, sooner if concerns  Hypothyroidism due to Hashimoto's thyroiditis UTD and compliant with levothyroxine 200 mcg Follow-up with endo as scheduled  Obesity, unspecified classification, unspecified obesity type, unspecified whether serious comorbidity present Continue efforts Work on getting back on track with healthy eating  Patient Counseling: [  x]   Nutrition: Stressed importance of moderation in sodium/caffeine intake, saturated fat and cholesterol, caloric balance, sufficient intake of fresh fruits, vegetables, fiber, calcium, iron, and 1 mg of folate supplement per day (for females capable of pregnancy).  '[x]'$    Stressed the importance of regular exercise.   '[x]'$    Substance Abuse: Discussed cessation/primary prevention of tobacco, alcohol, or other drug use; driving or other dangerous activities under the influence; availability of treatment for abuse.   '[x]'$    Injury prevention: Discussed safety belts, safety helmets, smoke detector, smoking near bedding or upholstery.   '[x]'$    Sexuality: Discussed sexually transmitted diseases, partner selection, use of condoms, avoidance of unintended pregnancy  and contraceptive alternatives.  '[x]'$    Dental health: Discussed importance of regular tooth brushing, flossing, and dental visits.  '[x]'$    Health maintenance and immunizations reviewed. Please refer to Health maintenance section.    Inda Coke, PA-C [place]

## 2022-04-22 ENCOUNTER — Encounter: Payer: Self-pay | Admitting: Physician Assistant

## 2022-04-27 ENCOUNTER — Encounter: Payer: Self-pay | Admitting: *Deleted

## 2022-06-07 ENCOUNTER — Other Ambulatory Visit: Payer: Self-pay | Admitting: Physician Assistant

## 2022-06-07 ENCOUNTER — Other Ambulatory Visit: Payer: Self-pay | Admitting: Internal Medicine

## 2022-07-02 ENCOUNTER — Encounter: Payer: Self-pay | Admitting: Internal Medicine

## 2022-07-02 ENCOUNTER — Ambulatory Visit: Payer: BC Managed Care – PPO | Admitting: Internal Medicine

## 2022-07-02 VITALS — BP 128/82 | HR 65 | Ht 66.0 in | Wt 230.4 lb

## 2022-07-02 DIAGNOSIS — E038 Other specified hypothyroidism: Secondary | ICD-10-CM | POA: Diagnosis not present

## 2022-07-02 DIAGNOSIS — E063 Autoimmune thyroiditis: Secondary | ICD-10-CM | POA: Diagnosis not present

## 2022-07-02 LAB — T4, FREE: Free T4: 1.21 ng/dL (ref 0.60–1.60)

## 2022-07-02 LAB — TSH: TSH: 0.6 u[IU]/mL (ref 0.35–5.50)

## 2022-07-02 MED ORDER — LEVOTHYROXINE SODIUM 200 MCG PO TABS: 200.0000 ug | ORAL_TABLET | Freq: Every day | ORAL | 3 refills | Status: DC

## 2022-07-02 NOTE — Progress Notes (Signed)
Patient ID: Carolyn Harvey, female   DOB: 10/31/1975, 46 y.o.   MRN: [id number]   HPI  Carolyn Harvey is a 46 y.o.-year-old female, returning for f/u for hypothyroidism due to Hashimoto's thyroiditis.  Last visit 1 year ago.  Interim history: In 2020, patient lost a significant amount of weight, almost 75 pounds, after starting to work at home and cooking most of her meals.  She was also active, doing yoga.  Afterwards, she returned to work in person and she gained a significant amount of weight back.  Before last visit, she gained approximately 50 pounds back.  Since last visit, she only gained 4 pounds. At last visit she had some fatigue as she was working long hours due to being short staffed at work.  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 She otherwise feels well, without complaints today.  Reviewed history: She has been dx with hypothyroidism in early 1990s >> on generic levothyroxine.  Previously on Synthroid d.a.w.  She is on levothyroxine 200 mcg daily (dose increased 06/2021): - in am, with the Fluoxetine  - fasting - at least 30 min from b'fast - no calcium - no iron - + multivitamins at night - no PPIs - not on Biotin  Reviewed her TFTs: Lab Results  Component Value Date   TSH 1.67 08/15/2021   TSH 9.93 (H) 07/02/2021   TSH 3.67 07/01/2020   TSH 3.67 11/21/2019   TSH 0.08 (L) 06/20/2019   TSH 0.93 06/17/2018   TSH 7.05 (H) 09/29/2017   TSH 4.63 (H) 06/29/2017   FREET4 1.43 08/15/2021   FREET4 0.95 07/02/2021   FREET4 1.02 07/01/2020   FREET4 1.23 11/21/2019   FREET4 1.37 06/20/2019   FREET4 1.23 06/17/2018   FREET4 1.03 09/29/2017   FREET4 1.34 06/29/2017    Her TPO antibodies were positive in the past: Component     Latest Ref Rng & Units 09/29/2017  Thyroglobulin Ab     < or = 1 IU/mL 190 (H)  Thyroperoxidase Ab SerPl-aCnc     <9 IU/mL 6   Pt denies: - feeling nodules in neck - hoarseness - dysphagia -  choking  She has positive family history of thyroid disorders: M - hyperthyroidism -  Had to have RAI tx; 2 sisters. + sister with RA.  + FH of thyroid cancer in first cousin. No h/o radiation tx to head or neck. No herbal supplements. No Biotin use. No recent steroids use.    She has a history of HTN.  On losartan. She is on fluoxetine.  Mother has colon cancer.  ROS: + see HPI  I reviewed pt's medications, allergies, PMH, social hx, family hx, and changes were documented in the history of present illness. Otherwise, unchanged from my initial visit note.   Past Medical History:  Diagnosis Date   Allergy    Depression    Hypertension    07/23/20 lost 100# and no longer needs meds x 6 months   Thyroid disease    Past Surgical History:  Procedure Laterality Date   CHOLECYSTECTOMY     TONSILLECTOMY     WISDOM TOOTH EXTRACTION     Social History   Socioeconomic History   Marital status: Single    Spouse name: Not on file   Number of children: 0  Occupational History   librarian  Tobacco Use   Smoking status: Never Smoker   Smokeless tobacco: Never Used  Substance and Sexual Activity  Alcohol use: Yes, beer, 1-2x a week   Drug use: No   Sexual activity: Yes  Other Topics Concern   Not on file  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     FLUoxetine (PROZAC) 10 MG capsule TAKE ONE CAPSULE BY MOUTH DAILY 90 capsule 3   FLUoxetine (PROZAC) 20 MG capsule TAKE ONE CAPSULE BY MOUTH DAILY 90 capsule 3   fluticasone (FLONASE) 50 MCG/ACT nasal spray Place 2 sprays daily into both nostrils. 16 g 6   levonorgestrel (MIRENA) 20 MCG/24HR IUD 1 each by Intrauterine route once.     levothyroxine (SYNTHROID) 200 MCG tablet TAKE 1 TABLET BY MOUTH DAILY 45 tablet 0    Multiple Vitamin (MULTIVITAMIN) tablet Take 1 tablet by mouth daily.     No current facility-administered medications on file prior to visit.   No Known Allergies Family History  Problem Relation Age of Onset   Asthma Mother    Miscarriages / Stillbirths Mother    Hypothyroidism Mother    Colon cancer Mother 2   Colon polyps Mother    Cancer Mother    Obesity Mother    Diabetes Father    Hypertension Father    Pulmonary embolism Father    Obesity Father    Miscarriages / Stillbirths Sister    Fibromyalgia Sister    Hypothyroidism Sister    Stroke Maternal Grandmother    CVA Maternal Grandfather    Alzheimer's disease Paternal Grandmother    Stroke Paternal Grandfather    Stomach cancer Paternal Uncle    Arthritis Sister    Obesity Sister    Breast cancer Neg Hx    Esophageal cancer Neg Hx    Rectal cancer Neg Hx    PE: BP 128/82 (BP Location: Right Arm, Patient Position: Sitting, Cuff Size: Normal)   Pulse 65   Ht '5\' 6"'$  (1.676 m)   Wt 230 lb 6.4 oz (104.5 kg)   SpO2 99%   BMI 37.19 kg/m  Wt Readings from Last 3 Encounters:  07/02/22 230 lb 6.4 oz (104.5 kg)  04/13/22 226 lb 6.4 oz (102.7 kg)  07/02/21 226 lb 6.4 oz (102.7 kg)   Constitutional: overweight, in NAD Eyes:  EOMI, no exophthalmos ENT: no neck masses, no cervical lymphadenopathy Cardiovascular: RRR, No MRG, + LLE edema Respiratory: CTA B Musculoskeletal: no deformities Skin:no rashes Neurological: no tremor with outstretched hands  ASSESSMENT: 1. Hypothyroidism due to Hashimoto's thyroiditis  PLAN:  1. Patient with hypothyroidism, diagnosed in high school, previously on high-dose levothyroxine therapy (225 mcg daily), which we were able to decrease after she lost a significant amount of weight (73 lbs in 1 year).  However, before last visit, she gained 47-50 pounds in the previous year and we had to increase the dose. -We diagnosed Hashimoto's thyroiditis based on elevated thyroid antibodies -  latest thyroid labs reviewed with pt. >> normal: Lab Results  Component Value Date   TSH 1.67 08/15/2021  - she continues on LT4 200 mcg daily, dose increased at last visit - the reason why she requires such a high dose of levothyroxine is probably the fact that she is taking it along with fluoxetine, but this works well for her, and she does this every morning.  Therefore, I  did not advise her to separate it. - pt feels good on this dose.  No weight loss since last visit - gained 4 lbs. - we discussed about taking the thyroid hormone every day, with water, >30 minutes before breakfast, separated by >4 hours from acid reflux medications, calcium, iron, multivitamins. Pt. is taking it correctly. - will check thyroid tests today: TSH and fT4 - If labs are abnormal, she will need to return for repeat TFTs in 1.5 months  Needs a refill of LT4.   Component     Latest Ref Rng 07/02/2022  TSH     0.35 - 5.50 uIU/mL 0.60   T4,Free(Direct)     0.60 - 1.60 ng/dL 1.21    Normal TFTs.  We will refill the same dose of levothyroxine.  Philemon Kingdom, MD PhD [facility]

## 2022-07-02 NOTE — Patient Instructions (Signed)
Please stop at the lab.  Please continue Levothyroxine 200 mcg daily.  Take the thyroid hormone every day, with water, at least 30 minutes before breakfast, separated by at least 4 hours from: - acid reflux medications - calcium - iron - multivitamins  Please come back for a follow-up appointment in 1 year.

## 2022-07-16 ENCOUNTER — Encounter: Payer: Self-pay | Admitting: *Deleted

## 2022-07-20 ENCOUNTER — Other Ambulatory Visit: Payer: Self-pay | Admitting: Internal Medicine

## 2022-11-06 ENCOUNTER — Other Ambulatory Visit: Payer: Self-pay | Admitting: Internal Medicine

## 2022-12-10 ENCOUNTER — Ambulatory Visit: Payer: BC Managed Care – PPO | Admitting: Nurse Practitioner

## 2022-12-10 ENCOUNTER — Encounter: Payer: Self-pay | Admitting: Nurse Practitioner

## 2022-12-10 VITALS — BP 135/84 | HR 76 | Temp 98.2°F | Ht 66.0 in | Wt 242.0 lb

## 2022-12-10 DIAGNOSIS — Z6839 Body mass index (BMI) 39.0-39.9, adult: Secondary | ICD-10-CM

## 2022-12-10 DIAGNOSIS — E063 Autoimmune thyroiditis: Secondary | ICD-10-CM

## 2022-12-10 DIAGNOSIS — Z0289 Encounter for other administrative examinations: Secondary | ICD-10-CM

## 2022-12-10 DIAGNOSIS — E038 Other specified hypothyroidism: Secondary | ICD-10-CM | POA: Diagnosis not present

## 2022-12-10 DIAGNOSIS — E669 Obesity, unspecified: Secondary | ICD-10-CM | POA: Diagnosis not present

## 2022-12-10 NOTE — Progress Notes (Signed)
Office: [phone]  /  Fax: [phone]   Initial Visit  Carolyn Harvey was seen in clinic today to evaluate for obesity. She is interested in losing weight to improve overall health and reduce the risk of weight related complications. She presents today to review program treatment options, initial physical assessment, and evaluation.     She was referred by: Friend or Family  When asked what else they would like to accomplish? She states: Improve existing medical conditions, Improve quality of life, and Lose a target amount of weight : Goal weight: 160 lbs   Weight history:  She has struggled with her weight since she was a child.  Her weight has fluctuated over the years.  Her mother passed away 04-07-24from colon cancer.    When asked how has your weight affected you? She states: Contributed to medical problems, Having fatigue, and Having poor endurance  Some associated conditions: HTN, Hypothyroidism, Vit D def  Contributing factors: Family history, Stress, and Life event  Weight promoting medications identified: None  Current nutrition plan: None  Current level of physical activity: walking her dog  Current or previous pharmacotherapy: None  Response to medication: Never tried medications   Past medical history includes:   Past Medical History:  Diagnosis Date   Allergy    Depression    Hypertension    07/23/20 lost 100# and no longer needs meds x 6 months   Thyroid disease      Objective:   BP 135/84   Pulse 76   Temp 98.2 F (36.8 C)   Ht 5\' 6"  (1.676 m)   Wt 242 lb (109.8 kg)   LMP  (LMP Unknown)   SpO2 96%   BMI 39.06 kg/m  She was weighed on the bioimpedance scale: Body mass index is 39.06 kg/m.  Peak Weight:290 lbs , Body Fat%:44.2, Visceral Fat Rating:12, Weight trend over the last 12 months: Increasing  General:  Alert, oriented and cooperative. Patient is in no acute distress.  Respiratory: Normal respiratory effort, no problems with  respiration noted   Gait: able to ambulate independently  Mental Status: Normal mood and affect. Normal behavior. Normal judgment and thought content.   DIAGNOSTIC DATA REVIEWED:  BMET    Component Value Date/Time   NA 140 04/13/2022 0937   K 4.9 04/13/2022 0937   CL 104 04/13/2022 0937   CO2 32 04/13/2022 0937   GLUCOSE 79 04/13/2022 0937   BUN 14 04/13/2022 0937   CREATININE 0.80 04/13/2022 0937   CALCIUM 9.0 04/13/2022 0937   Lab Results  Component Value Date   HGBA1C 5.3 04/10/2021   HGBA1C 5.5 06/29/2017   No results found for: "INSULIN" CBC    Component Value Date/Time   WBC 4.6 04/13/2022 0937   RBC 4.17 04/13/2022 0937   HGB 13.1 04/13/2022 0937   HCT 39.1 04/13/2022 0937   PLT 262.0 04/13/2022 0937   MCV 93.8 04/13/2022 0937   MCHC 33.4 04/13/2022 0937   RDW 12.9 04/13/2022 0937   Iron/TIBC/Ferritin/ %Sat No results found for: "IRON", "TIBC", "FERRITIN", "IRONPCTSAT" Lipid Panel     Component Value Date/Time   CHOL 147 04/13/2022 0937   TRIG 75.0 04/13/2022 0937   HDL 42.40 04/13/2022 0937   CHOLHDL 3 04/13/2022 0937   VLDL 15.0 04/13/2022 0937   LDLCALC 90 04/13/2022 0937   Hepatic Function Panel     Component Value Date/Time   PROT 6.2 04/13/2022 0937   ALBUMIN 3.6 04/13/2022 0937   AST  19 04/13/2022 0937   ALT 15 04/13/2022 0937   ALKPHOS 46 04/13/2022 0937   BILITOT 0.7 04/13/2022 0937      Component Value Date/Time   TSH 0.60 07/02/2022 1023     Assessment and Plan:   Hypothyroidism due to Hashimoto's thyroiditis Continue to follow up with PCP.  Continue meds as directed.    Generalized obesity  BMI 39.0-39.9,adult        Obesity Treatment / Action Plan:  Patient will work on garnering support from family and friends to begin weight loss journey. Will work on eliminating or reducing the presence of highly palatable, calorie dense foods in the home. Will complete provided nutritional and psychosocial assessment  questionnaire before the next appointment. Will be scheduled for indirect calorimetry to determine resting energy expenditure in a fasting state.  This will allow [place] to create a reduced calorie, high-protein meal plan to promote loss of fat mass while preserving muscle mass.  Obesity Education Performed Today:  She was weighed on the bioimpedance scale and results were discussed and documented in the synopsis.  We discussed obesity as a disease and the importance of a more detailed evaluation of all the factors contributing to the disease.  We discussed the importance of long term lifestyle changes which include nutrition, exercise and behavioral modifications as well as the importance of customizing this to her specific health and social needs.  We discussed the benefits of reaching a healthier weight to alleviate the symptoms of existing conditions and reduce the risks of the biomechanical, metabolic and psychological effects of obesity.  Carolyn Harvey appears to be in the action stage of change and states they are ready to start intensive lifestyle modifications and behavioral modifications.  30 minutes was spent today on this visit including the above counseling, pre-visit chart review, and post-visit documentation.  Reviewed by clinician on day of visit: allergies, medications, problem list, medical history, surgical history, family history, social history, and previous encounter notes pertinent to obesity diagnosis.    Theodis Sato Carolyn Vita FNP-C

## 2022-12-30 LAB — HM MAMMOGRAPHY

## 2022-12-31 ENCOUNTER — Encounter: Payer: Self-pay | Admitting: Bariatrics

## 2022-12-31 ENCOUNTER — Ambulatory Visit: Payer: BC Managed Care – PPO | Admitting: Bariatrics

## 2022-12-31 VITALS — BP 132/82 | HR 78 | Temp 98.6°F | Ht 66.0 in | Wt 243.0 lb

## 2022-12-31 DIAGNOSIS — R0602 Shortness of breath: Secondary | ICD-10-CM

## 2022-12-31 DIAGNOSIS — E038 Other specified hypothyroidism: Secondary | ICD-10-CM

## 2022-12-31 DIAGNOSIS — E063 Autoimmune thyroiditis: Secondary | ICD-10-CM | POA: Diagnosis not present

## 2022-12-31 DIAGNOSIS — I10 Essential (primary) hypertension: Secondary | ICD-10-CM | POA: Diagnosis not present

## 2022-12-31 DIAGNOSIS — E559 Vitamin D deficiency, unspecified: Secondary | ICD-10-CM | POA: Diagnosis not present

## 2022-12-31 DIAGNOSIS — Z1331 Encounter for screening for depression: Secondary | ICD-10-CM

## 2022-12-31 DIAGNOSIS — F32A Depression, unspecified: Secondary | ICD-10-CM | POA: Diagnosis not present

## 2022-12-31 DIAGNOSIS — R5383 Other fatigue: Secondary | ICD-10-CM | POA: Insufficient documentation

## 2022-12-31 DIAGNOSIS — Z0000 Encounter for general adult medical examination without abnormal findings: Secondary | ICD-10-CM

## 2022-12-31 DIAGNOSIS — E669 Obesity, unspecified: Secondary | ICD-10-CM

## 2022-12-31 DIAGNOSIS — Z833 Family history of diabetes mellitus: Secondary | ICD-10-CM

## 2022-12-31 DIAGNOSIS — Z6839 Body mass index (BMI) 39.0-39.9, adult: Secondary | ICD-10-CM

## 2022-12-31 HISTORY — DX: Encounter for general adult medical examination without abnormal findings: Z00.00

## 2023-01-01 LAB — COMPREHENSIVE METABOLIC PANEL
ALT: 17 IU/L (ref 0–32)
AST: 20 IU/L (ref 0–40)
Albumin/Globulin Ratio: 1.6 (ref 1.2–2.2)
Albumin: 4.1 g/dL (ref 3.9–4.9)
Alkaline Phosphatase: 78 IU/L (ref 44–121)
BUN/Creatinine Ratio: 16 (ref 9–23)
BUN: 14 mg/dL (ref 6–24)
Bilirubin Total: 0.6 mg/dL (ref 0.0–1.2)
CO2: 24 mmol/L (ref 20–29)
Calcium: 9.5 mg/dL (ref 8.7–10.2)
Chloride: 101 mmol/L (ref 96–106)
Creatinine, Ser: 0.89 mg/dL (ref 0.57–1.00)
Globulin, Total: 2.5 g/dL (ref 1.5–4.5)
Glucose: 94 mg/dL (ref 70–99)
Potassium: 5.3 mmol/L — ABNORMAL HIGH (ref 3.5–5.2)
Sodium: 141 mmol/L (ref 134–144)
Total Protein: 6.6 g/dL (ref 6.0–8.5)
eGFR: 80 mL/min/{1.73_m2} (ref >59)

## 2023-01-01 LAB — LIPID PANEL WITH LDL/HDL RATIO
Cholesterol, Total: 174 mg/dL (ref 100–199)
HDL: 51 mg/dL (ref >39)
LDL Chol Calc (NIH): 107 mg/dL — ABNORMAL HIGH (ref 0–99)
LDL/HDL Ratio: 2.1 ratio (ref 0.0–3.2)
Triglycerides: 84 mg/dL (ref 0–149)
VLDL Cholesterol Cal: 16 mg/dL (ref 5–40)

## 2023-01-01 LAB — INSULIN, RANDOM: INSULIN: 7.5 u[IU]/mL (ref 2.6–24.9)

## 2023-01-01 LAB — TSH+T4F+T3FREE
Free T4: 1.52 ng/dL (ref 0.82–1.77)
T3, Free: 2.7 pg/mL (ref 2.0–4.4)
TSH: 0.704 u[IU]/mL (ref 0.450–4.500)

## 2023-01-01 LAB — VITAMIN D 25 HYDROXY (VIT D DEFICIENCY, FRACTURES): Vit D, 25-Hydroxy: 62.5 ng/mL (ref 30.0–100.0)

## 2023-01-01 LAB — HEMOGLOBIN A1C
Est. average glucose Bld gHb Est-mCnc: 108 mg/dL
Hgb A1c MFr Bld: 5.4 % (ref 4.8–5.6)

## 2023-01-04 ENCOUNTER — Encounter: Payer: Self-pay | Admitting: Bariatrics

## 2023-01-04 NOTE — Progress Notes (Signed)
Chief Complaint:   OBESITY Carolyn Harvey (MR# [id number]) is a 47 y.o. female who presents for evaluation and treatment of obesity and related comorbidities. Current BMI is Body mass index is 39.22 kg/m. Carolyn Harvey has been struggling with her weight for many years and has been unsuccessful in either losing weight, maintaining weight loss, or reaching her healthy weight goal.  Carolyn Harvey is currently in the action stage of change and ready to dedicate time achieving and maintaining a healthier weight. Carolyn Harvey is interested in becoming our patient and working on intensive lifestyle modifications including (but not limited to) diet and exercise for weight loss.  Patient is here for her initial visit. She met with Judeth Cornfield, Nurse practitioner on 12/10/2022.   Carolyn Harvey's habits were reviewed today and are as follows: Her family eats meals together, she thinks her family will eat healthier with her, she struggles with family and or coworkers weight loss sabotage, her desired weight loss is 88 lbs, she has been heavy most of her life, she started gaining weight in middle school, her heaviest weight ever was 286 pounds, she has significant food cravings issues, she is frequently drinking liquids with calories, she frequently makes poor food choices, she frequently eats larger portions than normal, and she struggles with emotional eating.  Depression Screen Mystery's Food and Mood (modified PHQ-9) score was 6.  Subjective:   1. Other fatigue Carolyn Harvey denies daytime somnolence and denies waking up still tired. Carolyn Harvey generally gets 8 or 9 hours of sleep per night, and states that she has generally restful sleep. Snoring is not present. Apneic episodes are not present. Epworth Sleepiness Score is 0.   2. SOB (shortness of breath) on exertion Carolyn Harvey notes increasing shortness of breath with exercising and seems to be worsening over time with weight gain. She notes getting out of breath sooner with activity than she used to.  This has not gotten worse recently. Carolyn Harvey denies shortness of breath at rest or orthopnea.  3. Hypothyroidism due to Hashimoto's thyroiditis Patient is taking Synthroid, and her symptoms are controlled. She was diagnosed a teenager. She has a cousin with thyroid cancer (papillary).   4. Essential hypertension Patient is not on medications. She was previously on losartan.   5. Health care maintenance Given obesity.   6. Vitamin D deficiency Patient has a history of Vit D deficiency.   7. Family history of diabetes mellitus Patient has a paternal family history of diabetes mellitus.   Assessment/Plan:   1. Other fatigue Carolyn Harvey does feel that her weight is causing her energy to be lower than it should be. Fatigue may be related to obesity, depression or many other causes. Labs will be ordered, and in the meanwhile, Carolyn Harvey will focus on self care including making healthy food choices, increasing physical activity and focusing on stress reduction.  - EKG 12-Lead - TSH+T4F+T3Free  2. SOB (shortness of breath) on exertion Carolyn Harvey does feel that she gets out of breath more easily that she used to when she exercises. Carolyn Harvey's shortness of breath appears to be obesity related and exercise induced. She has agreed to work on weight loss and gradually increase exercise to treat her exercise induced shortness of breath. Will continue to monitor closely.  3. Hypothyroidism due to Hashimoto's thyroiditis We will check labs today. Patient will continue her medications.   - TSH+T4F+T3Free  4. Essential hypertension Patient will continue with her diet and exercise.   5. Health care maintenance We will check labs today. EKG  and IC were done today and reviewed with the patient.   - Hemoglobin A1c - Lipid Panel With LDL/HDL Ratio - TSH+T4F+T3Free - VITAMIN D 25 Hydroxy (Vit-D Deficiency, Fractures) - Comprehensive metabolic panel - Insulin, random  6. Vitamin D deficiency We will check labs today, and  we will follow-up at her next visit.  - VITAMIN D 25 Hydroxy (Vit-D Deficiency, Fractures)  7. Family history of diabetes mellitus We will check labs today, and we will follow-up at her next visit.  - Hemoglobin A1c - Comprehensive metabolic panel - Insulin, random  8. Depression screening Carolyn Harvey had a positive depression screening. Depression is commonly associated with obesity and often results in emotional eating behaviors. We will monitor this closely and work on CBT to help improve the non-hunger eating patterns. Referral to Psychology may be required if no improvement is seen as she continues in our clinic.  9. Generalized obesity  10. BMI 39.0-39.9,adult Carolyn Harvey is currently in the action stage of change and her goal is to continue with weight loss efforts. I recommend Carolyn Harvey begin the structured treatment plan as follows:  She has agreed to the Category 3 Plan.  Meal planning and intentional eating were discussed. Patient will monitor her portion size.   Exercise goals: No exercise has been prescribed at this time.   Behavioral modification strategies: increasing lean protein intake, decreasing simple carbohydrates, increasing vegetables, increasing water intake, decreasing eating out, no skipping meals, meal planning and cooking strategies, keeping healthy foods in the home, and planning for success.  She was informed of the importance of frequent follow-up visits to maximize her success with intensive lifestyle modifications for her multiple health conditions. She was informed we would discuss her lab results at her next visit unless there is a critical issue that needs to be addressed sooner. Carolyn Harvey agreed to keep her next visit at the agreed upon time to discuss these results.  Objective:   Blood pressure 132/82, pulse 78, temperature 98.6 F (37 C), height 5\' 6"  (1.676 m), weight 243 lb (110.2 kg), SpO2 98 %. Body mass index is 39.22 kg/m.  EKG: Normal sinus rhythm, rate 67  BPM.  Indirect Calorimeter completed today shows a VO2 of 297 and a REE of 2045.  Her calculated basal metabolic rate is 1610 thus her basal metabolic rate is better than expected.  General: Cooperative, alert, well developed, in no acute distress. HEENT: Conjunctivae and lids unremarkable. Cardiovascular: Regular rhythm.  Lungs: Normal work of breathing. Neurologic: No focal deficits.   Lab Results  Component Value Date   CREATININE 0.89 12/31/2022   BUN 14 12/31/2022   NA 141 12/31/2022   K 5.3 (H) 12/31/2022   CL 101 12/31/2022   CO2 24 12/31/2022   Lab Results  Component Value Date   ALT 17 12/31/2022   AST 20 12/31/2022   ALKPHOS 78 12/31/2022   BILITOT 0.6 12/31/2022   Lab Results  Component Value Date   HGBA1C 5.4 12/31/2022   HGBA1C 5.3 04/10/2021   HGBA1C 5.4 08/24/2018   HGBA1C 5.5 06/29/2017   Lab Results  Component Value Date   INSULIN 7.5 12/31/2022   Lab Results  Component Value Date   TSH 0.704 12/31/2022   Lab Results  Component Value Date   CHOL 174 12/31/2022   HDL 51 12/31/2022   LDLCALC 107 (H) 12/31/2022   TRIG 84 12/31/2022   CHOLHDL 3 04/13/2022   Lab Results  Component Value Date   WBC 4.6 04/13/2022  HGB 13.1 04/13/2022   HCT 39.1 04/13/2022   MCV 93.8 04/13/2022   PLT 262.0 04/13/2022   No results found for: "IRON", "TIBC", "FERRITIN"  Attestation Statements:   Reviewed by clinician on day of visit: allergies, medications, problem list, medical history, surgical history, family history, social history, and previous encounter notes.   Trude Mcburney, am acting as Energy manager for [employer], DO.  I have reviewed the above documentation for accuracy and completeness, and I agree with the above. Corinna Capra, DO

## 2023-01-14 ENCOUNTER — Ambulatory Visit: Payer: BC Managed Care – PPO | Admitting: Nurse Practitioner

## 2023-01-18 ENCOUNTER — Ambulatory Visit: Payer: BC Managed Care – PPO | Admitting: Nurse Practitioner

## 2023-01-18 ENCOUNTER — Encounter: Payer: Self-pay | Admitting: Nurse Practitioner

## 2023-01-18 VITALS — BP 134/69 | HR 65 | Temp 98.4°F | Ht 66.0 in | Wt 240.0 lb

## 2023-01-18 DIAGNOSIS — E785 Hyperlipidemia, unspecified: Secondary | ICD-10-CM | POA: Diagnosis not present

## 2023-01-18 DIAGNOSIS — E669 Obesity, unspecified: Secondary | ICD-10-CM | POA: Diagnosis not present

## 2023-01-18 DIAGNOSIS — Z6838 Body mass index (BMI) 38.0-38.9, adult: Secondary | ICD-10-CM

## 2023-01-18 DIAGNOSIS — E875 Hyperkalemia: Secondary | ICD-10-CM

## 2023-01-18 DIAGNOSIS — N951 Menopausal and female climacteric states: Secondary | ICD-10-CM | POA: Diagnosis not present

## 2023-01-18 NOTE — Progress Notes (Signed)
Office: [phone]  /  Fax: [phone]  WEIGHT SUMMARY AND BIOMETRICS  Weight Lost Since Last Visit: 3lb  No data recorded  Vitals Temp: 98.4 F (36.9 C) BP: 134/69 Pulse Rate: 65 SpO2: 98 %   Anthropometric Measurements Height: 5\' 6"  (1.676 m) Weight: 240 lb (108.9 kg) BMI (Calculated): 38.76 Weight at Last Visit: 243lb Weight Lost Since Last Visit: 3lb Starting Weight: 243lb Total Weight Loss (lbs): 3 lb (1.361 kg)   Body Composition  Body Fat %: 42.2 % Fat Mass (lbs): 101.6 lbs Muscle Mass (lbs): 132 lbs Total Body Water (lbs): 96.2 lbs Visceral Fat Rating : 12   Other Clinical Data Fasting: No Labs: No Today's Visit #: 2 Starting Date: 12/31/22     HPI  Chief Complaint: OBESITY  Carolyn Harvey is here to discuss her progress with her obesity treatment plan. She is on the the Category 3 Plan and states she is following her eating plan approximately 80-90 % of the time. She states she is exercising 40 minutes 5 days per week.   Interval History:  Since last office visit she has lost 3 pounds.  She is following the plan well Monday-Friday.  She eats out Friday night. She is eating protein with every meal.  She started using the lose it app:  calories 1400-1600, protein 120-160 grams and carbs 115-172.   She started riding her bike and started resistance training since her last visit.  She is drinking water daily and occ beer.    Pharmacotherapy for weight loss: She is not currently taking medications  for medical weight loss.      Previous pharmacotherapy for medical weight loss:  none  Bariatric surgery:  Patient has not had bariatric surgery.    Hyperlipidemia Medication(s): none.    FH:  father  Lab Results  Component Value Date   CHOL 174 12/31/2022   HDL 51 12/31/2022   LDLCALC 107 (H) 12/31/2022   TRIG 84 12/31/2022   CHOLHDL 3 04/13/2022   Lab Results  Component Value Date   ALT 17 12/31/2022   AST 20 12/31/2022   ALKPHOS 78 12/31/2022    BILITOT 0.6 12/31/2022   The 10-year ASCVD risk score (Arnett DK, et al., 2019) is: 0.9%   Values used to calculate the score:     Age: 47 years     Sex: Female     Is Non-Hispanic African American: No     Diabetic: No     Tobacco smoker: No     Systolic Blood Pressure: 134 mmHg     Is BP treated: No     HDL Cholesterol: 51 mg/dL     Total Cholesterol: 174 mg/dL   Menopausal symptoms Reports some problems with hot flashes.  Has an IUD.  Labs obtained today.  Hyperkalemia Last potassium was elevated.  Labs obtained today.   PHYSICAL EXAM:  Blood pressure 134/69, pulse 65, temperature 98.4 F (36.9 C), height 5\' 6"  (1.676 m), weight 240 lb (108.9 kg), SpO2 98 %. Body mass index is 38.74 kg/m.  General: She is overweight, cooperative, alert, well developed, and in no acute distress. PSYCH: Has normal mood, affect and thought process.   Extremities: No edema.  Neurologic: No gross sensory or motor deficits. No tremors or fasciculations noted.    DIAGNOSTIC DATA REVIEWED:  BMET    Component Value Date/Time   NA 141 12/31/2022 1129   K 5.3 (H) 12/31/2022 1129   CL 101 12/31/2022 1129   CO2 24  12/31/2022 1129   GLUCOSE 94 12/31/2022 1129   GLUCOSE 79 04/13/2022 0937   BUN 14 12/31/2022 1129   CREATININE 0.89 12/31/2022 1129   CALCIUM 9.5 12/31/2022 1129   Lab Results  Component Value Date   HGBA1C 5.4 12/31/2022   HGBA1C 5.5 06/29/2017   Lab Results  Component Value Date   INSULIN 7.5 12/31/2022   Lab Results  Component Value Date   TSH 0.704 12/31/2022   CBC    Component Value Date/Time   WBC 4.6 04/13/2022 0937   RBC 4.17 04/13/2022 0937   HGB 13.1 04/13/2022 0937   HCT 39.1 04/13/2022 0937   PLT 262.0 04/13/2022 0937   MCV 93.8 04/13/2022 0937   MCHC 33.4 04/13/2022 0937   RDW 12.9 04/13/2022 0937   Iron Studies No results found for: "IRON", "TIBC", "FERRITIN", "IRONPCTSAT" Lipid Panel     Component Value Date/Time   CHOL 174 12/31/2022 1129    TRIG 84 12/31/2022 1129   HDL 51 12/31/2022 1129   CHOLHDL 3 04/13/2022 0937   VLDL 15.0 04/13/2022 0937   LDLCALC 107 (H) 12/31/2022 1129   Hepatic Function Panel     Component Value Date/Time   PROT 6.6 12/31/2022 1129   ALBUMIN 4.1 12/31/2022 1129   AST 20 12/31/2022 1129   ALT 17 12/31/2022 1129   ALKPHOS 78 12/31/2022 1129   BILITOT 0.6 12/31/2022 1129      Component Value Date/Time   TSH 0.704 12/31/2022 1129   Nutritional Lab Results  Component Value Date   VD25OH 62.5 12/31/2022     ASSESSMENT AND PLAN  TREATMENT PLAN FOR OBESITY:  Recommended Dietary Goals  Carolyn Harvey is currently in the action stage of change. As such, her goal is to continue weight management plan. She has agreed to the Category 3 Plan.  Behavioral Intervention  We discussed the following Behavioral Modification Strategies today: increasing lean protein intake, decreasing simple carbohydrates , increasing vegetables, increasing lower glycemic fruits, increasing fiber rich foods, avoiding skipping meals, increasing water intake, continue to practice mindfulness when eating, and planning for success.  Additional resources provided today: protein shakes  Recommended Physical Activity Goals  Carolyn Harvey has been advised to work up to 150 minutes of moderate intensity aerobic activity a week and strengthening exercises 2-3 times per week for cardiovascular health, weight loss maintenance and preservation of muscle mass.   She has agreed to Continue current level of physical activity  and Think about ways to increase daily physical activity and overcoming barriers to exercise   ASSOCIATED CONDITIONS ADDRESSED TODAY  Action/Plan  Hyperkalemia -     Potassium  Will continue to monitor.   Menopausal symptoms -     FSH/LH  To follow up with GYN  Hyperlipidemia, unspecified hyperlipidemia type Cardiovascular risk and specific lipid/LDL goals reviewed.  We discussed several lifestyle modifications  today and Carolyn Harvey will continue to work on diet, exercise and weight loss efforts. Orders and follow up as documented in patient record.   Counseling Intensive lifestyle modifications are the first line treatment for this issue. Dietary changes: Increase soluble fiber. Decrease simple carbohydrates. Exercise changes: Moderate to vigorous-intensity aerobic activity 150 minutes per week if tolerated. Lipid-lowering medications: see documented in medical record.   Generalized obesity  BMI 38.0-38.9,adult      Labs reviewed with patient from 12/31/22. All questions answered.    Return in about 3 weeks (around 02/08/2023).Marland Kitchen She was informed of the importance of frequent follow up visits to maximize her success  with intensive lifestyle modifications for her multiple health conditions.   ATTESTASTION STATEMENTS:  Reviewed by clinician on day of visit: allergies, medications, problem list, medical history, surgical history, family history, social history, and previous encounter notes.   Time spent on visit including pre-visit chart review and post-visit care and charting was 30 minutes.    Theodis Sato. Liddy Deam FNP-C

## 2023-01-19 LAB — FSH/LH
FSH: 5.1 m[IU]/mL
LH: 6.3 m[IU]/mL

## 2023-01-19 LAB — POTASSIUM: Potassium: 5.1 mmol/L (ref 3.5–5.2)

## 2023-02-01 ENCOUNTER — Ambulatory Visit: Payer: BC Managed Care – PPO | Admitting: Nurse Practitioner

## 2023-02-01 ENCOUNTER — Encounter: Payer: Self-pay | Admitting: Nurse Practitioner

## 2023-02-01 VITALS — BP 135/81 | HR 64 | Temp 98.3°F | Ht 66.0 in | Wt 239.0 lb

## 2023-02-01 DIAGNOSIS — Z6838 Body mass index (BMI) 38.0-38.9, adult: Secondary | ICD-10-CM | POA: Diagnosis not present

## 2023-02-01 DIAGNOSIS — E669 Obesity, unspecified: Secondary | ICD-10-CM

## 2023-02-01 DIAGNOSIS — E875 Hyperkalemia: Secondary | ICD-10-CM | POA: Diagnosis not present

## 2023-02-01 NOTE — Patient Instructions (Signed)
Steps to starting your start Contrave  The office will send a prior authorization request to your insurance company for approval. We will send you a mychart message once we hear back from your insurance with a decision.  This can take up to 7-10 business days.   Is Contrave an option for me? Do not take Contrave if you:   Have uncontrolled hypertension  Have or have had seizures  Use other medicines that contain bupropion such as Wellbutrin, Wellbutrin SR, Wellbutrin XL, and Aplenzin.  Have or have had an eating disorder called anorexia (eating very little) or bulimia (eating too much and vomiting to avoid gaining weight)  Are dependent on opioid pain medicines or use medicines to help stop taking opioids such as methadone or buprenorphine, or are in opiate withdrawal  Drink a lot of alcohol and abruptly stop drinking, or use medicines called sedatives (these make you sleepy), benzodiazepines, or anti-seizure medicines and you stop using them all of a sudden  Are taking medicines called monoamine oxidase inhibitors (MAOIs). Ask your healthcare provider or pharmacist if you are not sure if you take an MAOI, including linezoid. Do not start Contrave until you have stopped taking your MAOI for at least 14 days.  Are allergic to naltrexone HCl or bupropion HCl or any of the ingredients in Contrave. See the end of this Medication Guide for a complete list of ingredients in Contrave.  Are pregnant or planning to become pregnant. Tell your healthcare provider right away if you become pregnant while taking CONTRAVE  What is Contrave and how does it work?  Contrave is a prescription only medicine to help with your weight loss. It works on an area of the brain that controls your appetite.  It is a combination of two medicines that are extended release: naltrexone HCL and bupropion HCL.  One of the ingredients in Contrave, bupropion, is the same ingredient in some other medicines used to treat  depression and to help people quit smoking. However, Contrave is not approved to treat depression or other mental illnesses, or to help people quit smoking (smoking cessation).   This medicine will be most effective when combined with a reduced calorie diet and physical activity.  How should I take Contrave?  Take exactly as your provider tells you to. It is an increasing dose according to the following chart:  Contrave How should I take Contrave?   It is best to take Contrave with food. However, do not take with high-fat meals.   Swallow the extended-release tablet whole. Do not crush, break, or chew it.   If you miss a dose, skip the missed dose and go back to your regular dosing schedule. Do not take extra medicine to make up for a missed dose.  Do not take more than 2 tablets in the morning and 2 tablets in the evening.  Do not take more than 2 tablets at the same time or more than 4 tablets in 1 day.  Do not stop taking Contrave without talking to your provider.   Stopping Contrave suddenly can cause serious side effects, such as seizures.  Swallow Contrave tablets whole. Do not cut, chew, or crush tablets.  Do not take Contrave when eating a high-fat meal. It may increase your risk of seizures.   What should I avoid while taking Contrave?  You should avoid other medicines that contain bupropion such as Wellbutrin, Wellbutrin SR, Wellbutrin XL and Aplenzin.  You should avoid opioid pain medications or medicines   to help stop taking opioids such as methadone or buprenorphine. There may be a risk of opioid overdose.  Do not drink alcohol while taking Contrave. If you drink alcohol, talk to your provider before starting Contrave.   Avoid eating a high-fat meal at the same time you take Contrave. It may increase your risk of seizures.   Women who can become pregnant: Use effective birth control (contraception) consistently while taking Contrave. If you miss a menstrual period,  STOP Contrave and call our office immediately. Monthly pregnancy tests will be performed at your appointment if indicated.  What side effects may I notice when taking Contrave?  Side effects that usually do not require medical attention (report to our office if they continue or are bothersome): o Nausea o Constipation (you may take over the counter laxative if needed) o Headache o Dry mouth (drink at least 64 oz. of fluid daily) o Trouble sleeping (insomnia) o Vomiting o Dizziness o Diarrhea   Side effects that you should report to our office as soon as possible: o Seizures: DO NOT take Contrave again o Depression or severe changes in mood o Thoughts about suicide or dying o Feeling anxious, agitated, irritable, restless, or nervous o Slowed breathing and/or shallow breathing o Severe drowsiness o Confusion o Signs of allergic reaction such as rash, itching, hives, fever, swollen lymph glands, painful sores in your mouth or around your eyes, swelling of your lips or tongue, chest pain, or trouble breathing o Increased blood pressure o Increased heart rate or palpitations o Stomach pain lasting more than a few days o Dark urine o Yellowing of the whites of your eyes o Severe tiredness o Sudden changes in vision o Swelling or redness in or around the eye  o Low blood sugar in Type 2 Diabetes.   Other important information  While taking CONTRAVE, you or your family members should pay close attention to any changes, especially sudden changes, in mood, behaviors, thoughts, or feelings and maintain communication with your provider. You will be asked to sign an informed consent prior to starting Contrave.  If another healthcare provider prescribes narcotic pain medication for you, please call our office immediately for advice regarding Contrave.  Your prescription will be sent electronically to your pharmacy.   Refills will require an office visit  What is Qsymia and how does it  work?  Qsymia is a prescription only medicine to help with your weight loss. It is a combination of two medicines that are low dose, long-acting: Phentermine & Topiramate. Qsymia contains low dose Phentermine which is a stimulant medicine that could affect your heart rate and blood pressure Qsymia is designed to help you feel satisfied faster that will help you to decrease portion size. Also, it helps curb late night snacking habits. Some food you usually enjoy may start to taste differently which will help you make healthier food choices.  This medicine will be most effective when combined with a reduced calorie diet and physical activity.  How should I take Qsymia? Take daily in the morning with breakfast. Swallow the extended-release capsule whole. Do not crush, break, or chew it.  If you miss a dose, take it as soon as possible. If it is after 12pm, skip the missed dose and go back to your regular dosing schedule. Do not take extra medicine to make up for the missed dose. You have received two separate prescriptions today. You will initially take a lower dose of 3.75mg/23mg for 14 days then increase   to a higher dose of 7.5mg/46mg for maintenance for 30 days. There are 4 total dosing options. Your provider will discuss any need to go up to a higher dose during your office visits.  If you are taking Levothyroxine, take the Levothyroxine 1 hours before breakfast and the take the Qsymia 1 hour after breakfast. Do not stop taking Qsymia without talking to your provider. Stopping Qsymia suddenly can cause serious side effects, such as seizures and headaches.   What should I avoid while taking Qsymia? Limit caffeine to 1 small cup daily. Examples are soda, coffee, tea, herbal tea, energy drinks, and chocolate Avoid decongestant medicines like Sudafed, Mucinex-D, and Zyrtec-D. Qsymia may cause you to feel dizzy, drowsy, or confused, or to have trouble thinking or speaking. Do not drive or do  anything else that could be dangerous until you know how this medicine affects you.  Women who can become pregnant: Use effective birth control (contraception) consistently while taking Qsymia. If you miss a menstrual period, STOP Qsymia and call our office immediately. Pregnancy tests will be performed at your appointment if indicated.  What side effects may I notice when taking Qsymia? Side effects that usually do not require medical attention (report to our office if they continue or are bothersome): Dry mouth (drink at least 64 oz of fluid daily) Constipation (you may take over the counter laxative if needed) Metallic taste in your mouth when drinking carbonated beverages Numbness or tingling in the hands, arms, feet or face that lasts more than a week Headache Sudden changes in vision Mental fuzziness (problems with concentration, attention, memory or speech) Trouble sleeping (insomnia) Side effects that you should report to our office as soon as possible: Increases in heart rate and/or palpitations (feeling like your heart is racing or pounding in your chest that lasts several minutes) Chest pain Increased blood pressure Dizziness or feeling faint Shortness of breath Irritability Feeling anxious, agitated, restless, or nervous Depression or severe changes in mood Problems urinating Unusual swelling of the legs Vomiting   Other important information You will be asked to sign an informed consent prior to starting Qysmia Qsymia is a federally controlled substance. Keep Qsymia in a safe place to prevent misuse and abuse. Selling or giving away Qsymia may harm others, and is against the law.  Your prescription will be sent to the pharmacy during your visit.  Refills will require an office visit. Your insurance may not cover the cost of this medicine. Our office will complete a pre-authorization if required by your insurance.  

## 2023-02-01 NOTE — Progress Notes (Signed)
Office: [phone]  /  Fax: [phone]  WEIGHT SUMMARY AND BIOMETRICS  Weight Lost Since Last Visit: 1lb  No data recorded  Vitals Temp: 98.3 F (36.8 C) BP: 135/81 Pulse Rate: 64 SpO2: 98 %   Anthropometric Measurements Height: 5\' 6"  (1.676 m) Weight: 239 lb (108.4 kg) BMI (Calculated): 38.59 Weight at Last Visit: 240lb Weight Lost Since Last Visit: 1lb Starting Weight: 243lb Total Weight Loss (lbs): 4 lb (1.814 kg)   Body Composition  Body Fat %: 42.2 % Fat Mass (lbs): 101.2 lbs Muscle Mass (lbs): 131.6 lbs Total Body Water (lbs): 98.2 lbs Visceral Fat Rating : 12   Other Clinical Data Fasting: Yes Labs: No Today's Visit #: 3 Starting Date: 12/31/22     HPI  Chief Complaint: OBESITY  Carolyn Harvey is here to discuss her progress with her obesity treatment plan. She is on the the Category 3 Plan and states she is following her eating plan approximately 80 % of the time. She states she is exercising 60 minutes 6 days per week.   Interval History:  Since last office visit she has lost 1 pound. She is using the lose it app:  calories:  1544, protein:  163 grams and carbs:  191.   She is drinking water, coffee and some tea. She has been exercising more-resistance training and bike. She is sleeping well and has a low stress level-out of school for the summer.     Pharmacotherapy for weight loss: She is not currently taking medications  for medical weight loss.    Previous pharmacotherapy for medical weight loss:  none  Bariatric surgery:  Has not had bariatric surgery.    Hyperkalemia Last labs looked better on 01/18/23.  Will continue to monitor  PHYSICAL EXAM:  Blood pressure 135/81, pulse 64, temperature 98.3 F (36.8 C), height 5\' 6"  (1.676 m), weight 239 lb (108.4 kg), SpO2 98 %. Body mass index is 38.58 kg/m.  General: She is overweight, cooperative, alert, well developed, and in no acute distress. PSYCH: Has normal mood, affect and thought  process.   Extremities: No edema.  Neurologic: No gross sensory or motor deficits. No tremors or fasciculations noted.    DIAGNOSTIC DATA REVIEWED:  BMET    Component Value Date/Time   NA 141 12/31/2022 1129   K 5.1 01/18/2023 0932   CL 101 12/31/2022 1129   CO2 24 12/31/2022 1129   GLUCOSE 94 12/31/2022 1129   GLUCOSE 79 04/13/2022 0937   BUN 14 12/31/2022 1129   CREATININE 0.89 12/31/2022 1129   CALCIUM 9.5 12/31/2022 1129   Lab Results  Component Value Date   HGBA1C 5.4 12/31/2022   HGBA1C 5.5 06/29/2017   Lab Results  Component Value Date   INSULIN 7.5 12/31/2022   Lab Results  Component Value Date   TSH 0.704 12/31/2022   CBC    Component Value Date/Time   WBC 4.6 04/13/2022 0937   RBC 4.17 04/13/2022 0937   HGB 13.1 04/13/2022 0937   HCT 39.1 04/13/2022 0937   PLT 262.0 04/13/2022 0937   MCV 93.8 04/13/2022 0937   MCHC 33.4 04/13/2022 0937   RDW 12.9 04/13/2022 0937   Iron Studies No results found for: "IRON", "TIBC", "FERRITIN", "IRONPCTSAT" Lipid Panel     Component Value Date/Time   CHOL 174 12/31/2022 1129   TRIG 84 12/31/2022 1129   HDL 51 12/31/2022 1129   CHOLHDL 3 04/13/2022 0937   VLDL 15.0 04/13/2022 0937   LDLCALC 107 (H) 12/31/2022  1129   Hepatic Function Panel     Component Value Date/Time   PROT 6.6 12/31/2022 1129   ALBUMIN 4.1 12/31/2022 1129   AST 20 12/31/2022 1129   ALT 17 12/31/2022 1129   ALKPHOS 78 12/31/2022 1129   BILITOT 0.6 12/31/2022 1129      Component Value Date/Time   TSH 0.704 12/31/2022 1129   Nutritional Lab Results  Component Value Date   VD25OH 62.5 12/31/2022     ASSESSMENT AND PLAN  TREATMENT PLAN FOR OBESITY:  Recommended Dietary Goals  Carolyn Harvey is currently in the action stage of change. As such, her goal is to continue weight management plan. She has agreed to the Category 3 Plan.  Behavioral Intervention  We discussed the following Behavioral Modification Strategies today: increasing  lean protein intake, decreasing simple carbohydrates , increasing vegetables, increasing lower glycemic fruits, increasing fiber rich foods, avoiding skipping meals, increasing water intake, continue to practice mindfulness when eating, and planning for success.  Additional resources provided today: NA  Recommended Physical Activity Goals  Carolyn Harvey has been advised to work up to 150 minutes of moderate intensity aerobic activity a week and strengthening exercises 2-3 times per week for cardiovascular health, weight loss maintenance and preservation of muscle mass.   She has agreed to    Pharmacotherapy We discussed various medication options to help Carolyn Harvey with her weight loss efforts and we both agreed to consider her options-information given on Contrave and Qsymia.  Will re discuss at next visit.   ASSOCIATED CONDITIONS ADDRESSED TODAY  Action/Plan  Hyperkalemia Will continue to monitor.    Generalized obesity  BMI 38.0-38.9,adult       Labs reviewed in chart from 01/18/23  Return in about 4 weeks (around 03/01/2023).Marland Kitchen She was informed of the importance of frequent follow up visits to maximize her success with intensive lifestyle modifications for her multiple health conditions.   ATTESTASTION STATEMENTS:  Reviewed by clinician on day of visit: allergies, medications, problem list, medical history, surgical history, family history, social history, and previous encounter notes.   Time spent on visit including pre-visit chart review and post-visit care and charting was 30 minutes.    Theodis Sato. Firman Petrow FNP-C

## 2023-02-09 ENCOUNTER — Encounter: Payer: Self-pay | Admitting: Nurse Practitioner

## 2023-02-11 ENCOUNTER — Ambulatory Visit: Payer: BC Managed Care – PPO | Admitting: Bariatrics

## 2023-02-11 ENCOUNTER — Encounter: Payer: Self-pay | Admitting: Bariatrics

## 2023-02-11 VITALS — BP 124/66 | HR 51 | Temp 97.9°F | Ht 66.0 in | Wt 242.0 lb

## 2023-02-11 DIAGNOSIS — R632 Polyphagia: Secondary | ICD-10-CM

## 2023-02-11 DIAGNOSIS — E039 Hypothyroidism, unspecified: Secondary | ICD-10-CM | POA: Diagnosis not present

## 2023-02-11 DIAGNOSIS — E669 Obesity, unspecified: Secondary | ICD-10-CM | POA: Diagnosis not present

## 2023-02-11 DIAGNOSIS — Z6839 Body mass index (BMI) 39.0-39.9, adult: Secondary | ICD-10-CM | POA: Diagnosis not present

## 2023-02-11 DIAGNOSIS — E038 Other specified hypothyroidism: Secondary | ICD-10-CM

## 2023-02-11 MED ORDER — ZEPBOUND 2.5 MG/0.5ML ~~LOC~~ SOAJ: 2.5000 mg | SUBCUTANEOUS | 0 refills | Status: DC

## 2023-02-11 NOTE — Progress Notes (Signed)
   WEIGHT SUMMARY AND BIOMETRICS  Weight Gained Since Last Visit: 3lb   Vitals Temp: 97.9 F (36.6 C) Pulse Rate: (!) 51 SpO2: 97 %   Anthropometric Measurements Height: 5\' 6"  (1.676 m) Weight: 242 lb (109.8 kg) BMI (Calculated): 39.08 Weight at Last Visit: 239lb Weight Gained Since Last Visit: 3lb Starting Weight: 243lb Total Weight Loss (lbs): 1 lb (0.454 kg)   Body Composition  Body Fat %: 45.2 % Fat Mass (lbs): 109.4 lbs Muscle Mass (lbs): 126 lbs Total Body Water (lbs): 97.4 lbs Visceral Fat Rating : 13   Other Clinical Data Fasting: no Labs: no Today's Visit #: 4 Starting Date: 12/31/22    OBESITY Taralyn is here to discuss her progress with her obesity treatment plan along with follow-up of her obesity related diagnoses.     Nutrition Plan: the Category 3 plan - 70% adherence.  Current exercise:  strength training and stationary bike.  Interim History:  She is up 3 lbs since her last visit.  Protein intake is as prescribed and Water intake is adequate.  Hunger is moderately controlled.  Cravings are moderately controlled.  Assessment/Plan:   .Hypothyroidism Stable.  Does not report symptoms associated with uncontrolled hypothyroidism. Medication(s): Levothyroxine 200 mcg daily. Lab Results  Component Value Date   TSH 0.704 12/31/2022    Plan: Continue levothyroxine at current dose. Counseling: The correct way to take levothyroxine is fasting, with water, separated by at least 30 minutes from breakfast, and separated by more than 4 hours from calcium, iron, multivitamins, acid reflux medications (PPIs).   Polyphagia Ericah endorses excessive hunger.  Medication(s): none Hunger is poorly controlled. Cravings are moderately controlled.   Plan: Medication(s): Zepbound 2.5 mg SQ weekly Will increase water, protein and fiber to help assuage hunger.  Will minimize foods that have a high glucose index/load to minimize reactive  hypoglycemia.  Patient denies absolute contraindications, and information sheet reviewed, and benefits and risks discussed.    Generalized Obesity: Current BMI BMI (Calculated): 39.08   Pharmacotherapy Plan Start  Zepbound 2.5 mg SQ weekly  Casandra is currently in the action stage of change. As such, her goal is to continue with weight loss efforts.  She has agreed to the Category 3 plan.  Exercise goals: For substantial health benefits, adults should do at least 150 minutes (2 hours and 30 minutes) a week of moderate-intensity, or 75 minutes (1 hour and 15 minutes) a week of vigorous-intensity aerobic physical activity, or an equivalent combination of moderate- and vigorous-intensity aerobic activity. Aerobic activity should be performed in episodes of at least 10 minutes, and preferably, it should be spread throughout the week.  Behavioral modification strategies: increasing lean protein intake, no meal skipping, decrease eating out, meal planning , and keep healthy foods in the home.  Kiylah has agreed to follow-up with our clinic in 2 weeks.       Objective:   VITALS: Per patient if applicable, see vitals. GENERAL: Alert and in no acute distress. CARDIOPULMONARY: No increased WOB. Speaking in clear sentences.  PSYCH: Pleasant and cooperative. Speech normal rate and rhythm. Affect is appropriate. Insight and judgement are appropriate. Attention is focused, linear, and appropriate.  NEURO: Oriented as arrived to appointment on time with no prompting.   Attestation Statements:    This was prepared with the assistance of Engineer, civil (consulting).  Occasional wrong-word or sound-a-like substitutions may have occurred due to the inherent limitations of voice recognition software.   Corinna Capra, DO

## 2023-02-14 ENCOUNTER — Encounter: Payer: Self-pay | Admitting: Bariatrics

## 2023-02-15 ENCOUNTER — Encounter: Payer: Self-pay | Admitting: Bariatrics

## 2023-02-15 ENCOUNTER — Other Ambulatory Visit (INDEPENDENT_AMBULATORY_CARE_PROVIDER_SITE_OTHER): Payer: Self-pay | Admitting: Bariatrics

## 2023-02-15 DIAGNOSIS — R632 Polyphagia: Secondary | ICD-10-CM

## 2023-02-15 MED ORDER — ZEPBOUND 2.5 MG/0.5ML ~~LOC~~ SOAJ: 2.5000 mg | SUBCUTANEOUS | 0 refills | Status: DC

## 2023-02-17 ENCOUNTER — Encounter (INDEPENDENT_AMBULATORY_CARE_PROVIDER_SITE_OTHER): Payer: Self-pay

## 2023-02-17 IMAGING — MG MM DIGITAL DIAGNOSTIC UNILAT*L* W/ TOMO W/ CAD
6 of 10 series · 6 of 30 positions shown · non-contrast
Comparison: Previous exam(s).

CLINICAL DATA: 44-year-old female for further evaluation of LEFT
breast asymmetry on screening mammogram.

EXAM:
DIGITAL DIAGNOSTIC UNILATERAL LEFT MAMMOGRAM WITH TOMOSYNTHESIS AND
CAD
TECHNIQUE: Left digital diagnostic mammography and breast tomosynthesis was
performed. The images were evaluated with computer-aided detection.

[L ML synth-2D (1 of 3)]
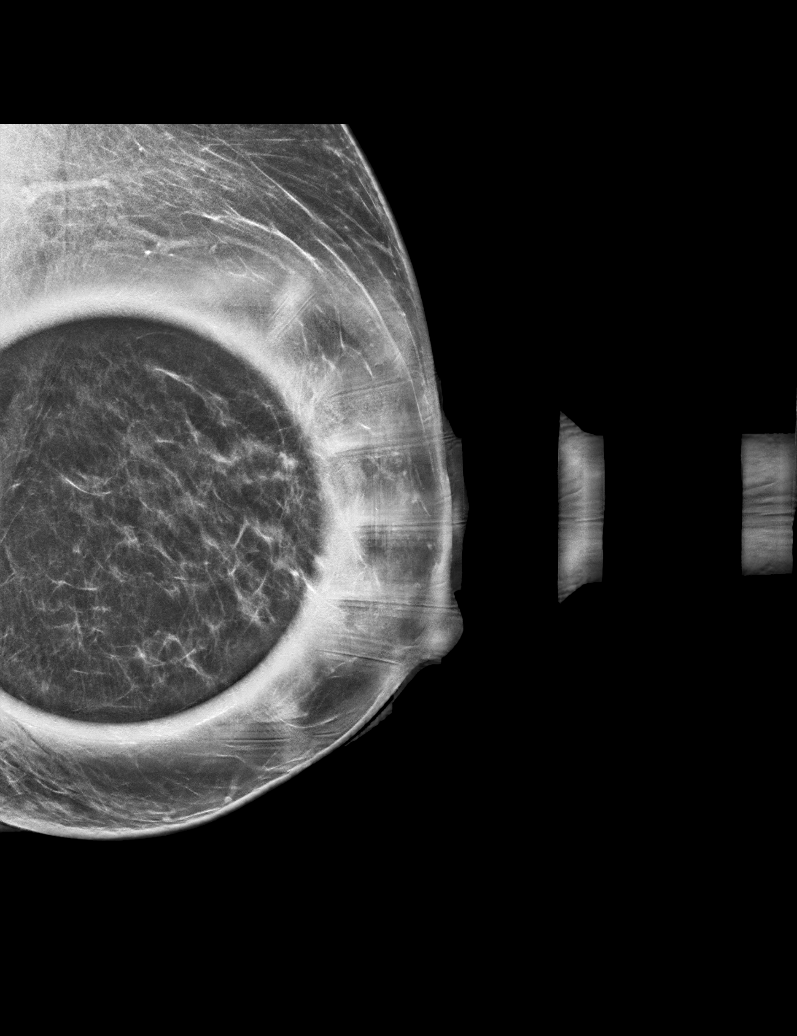

[L MLO synth-2D]
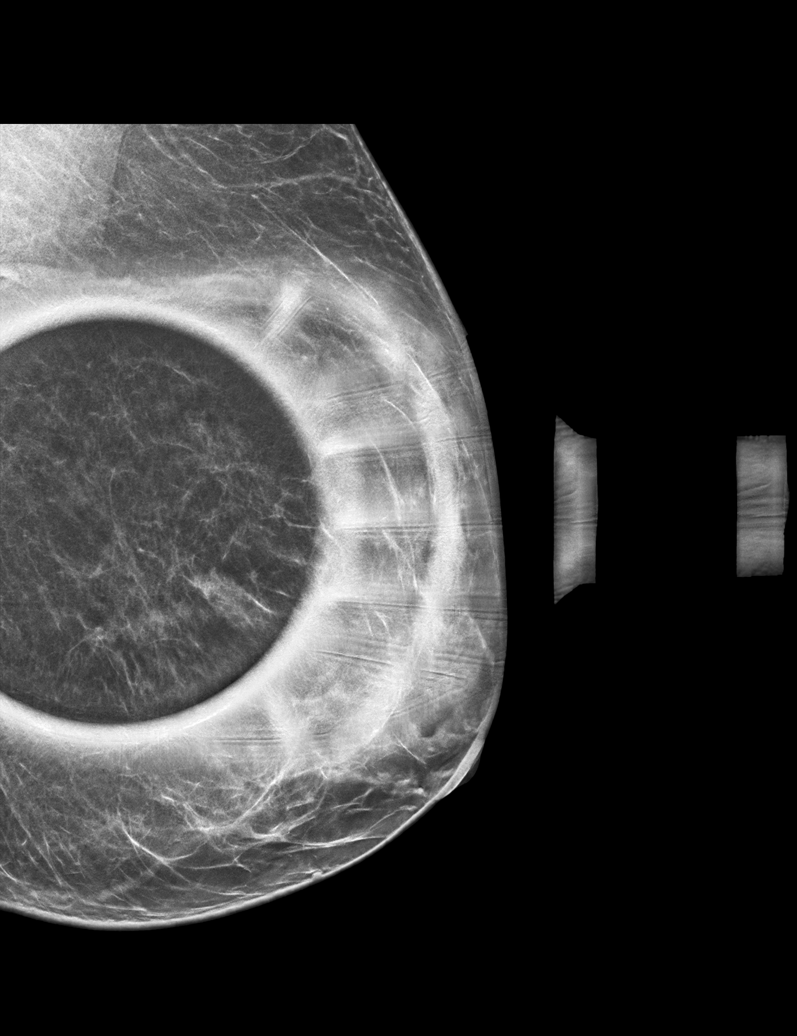

[L CC synth-2D]
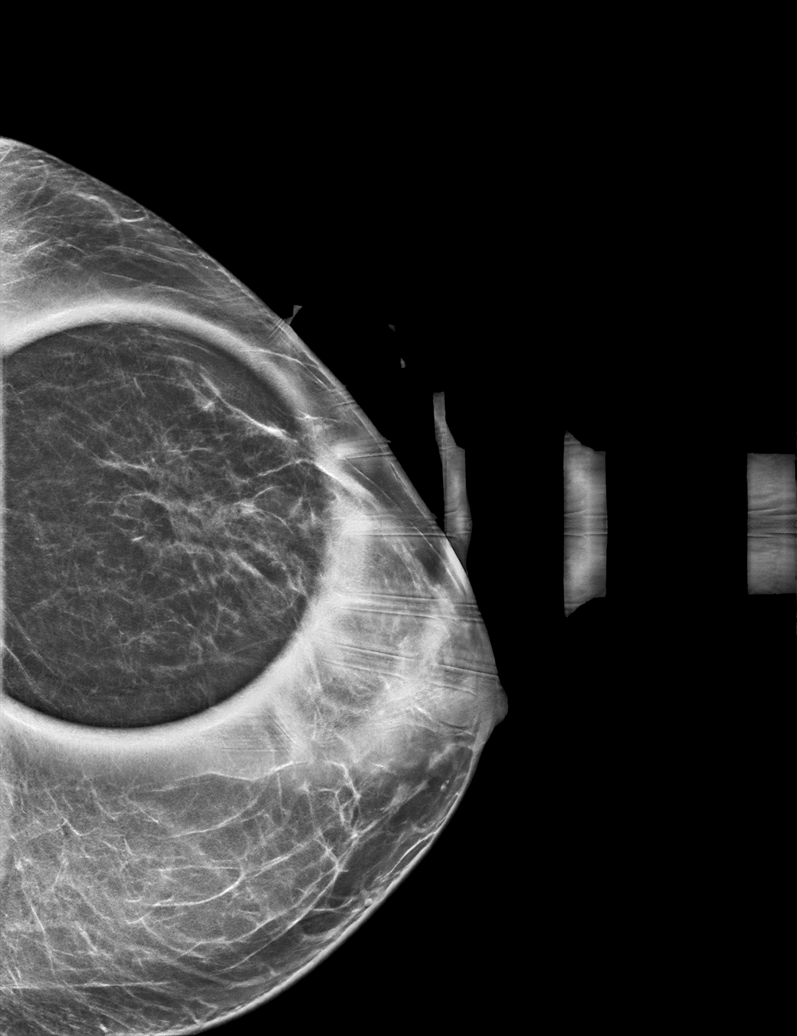

[L ML synth-2D (2 of 3)]
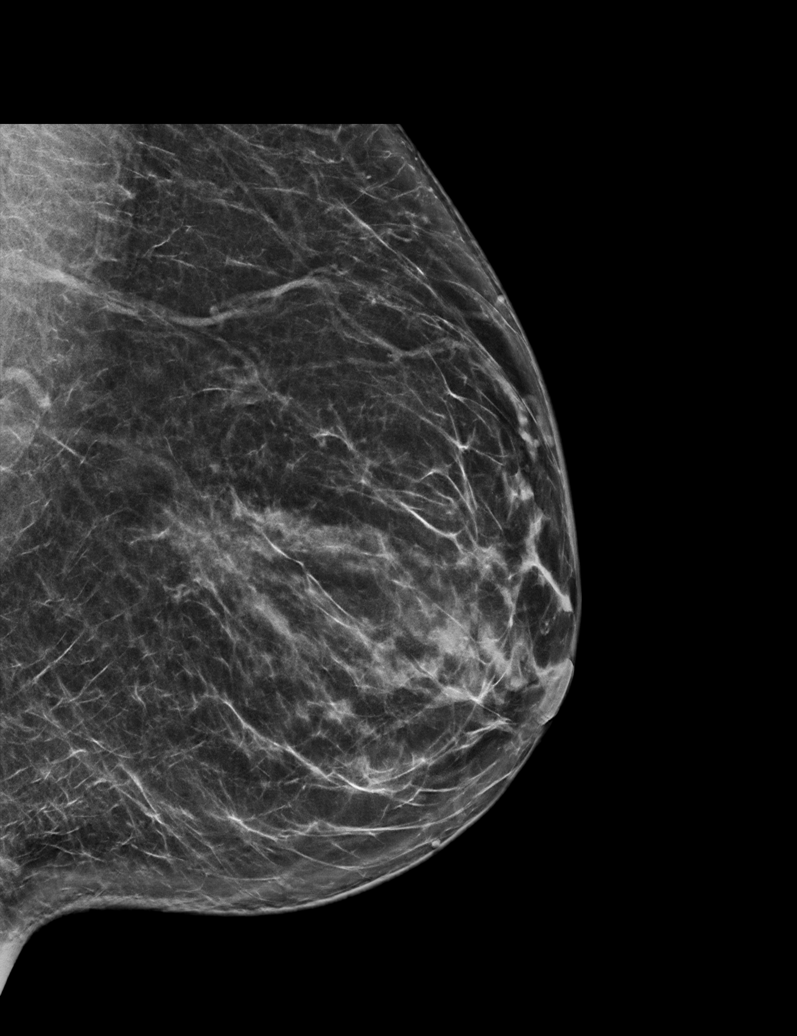

[L ML synth-2D (3 of 3)]
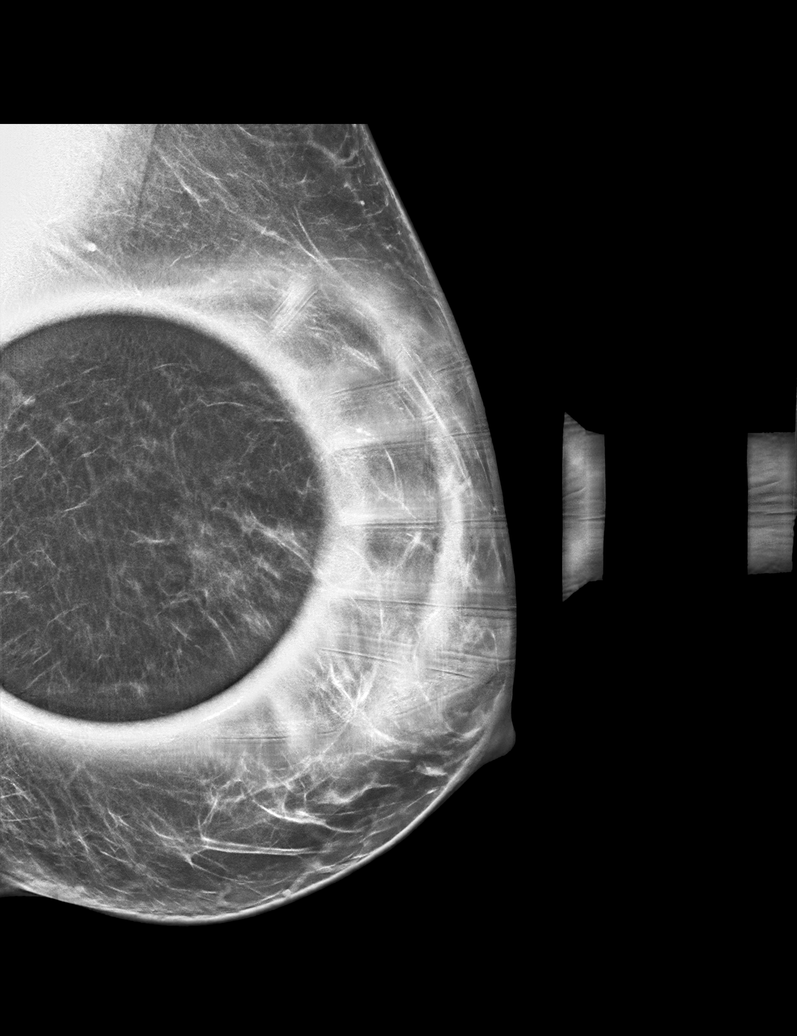

[L CC tomo · tomo slice 29/58.0]
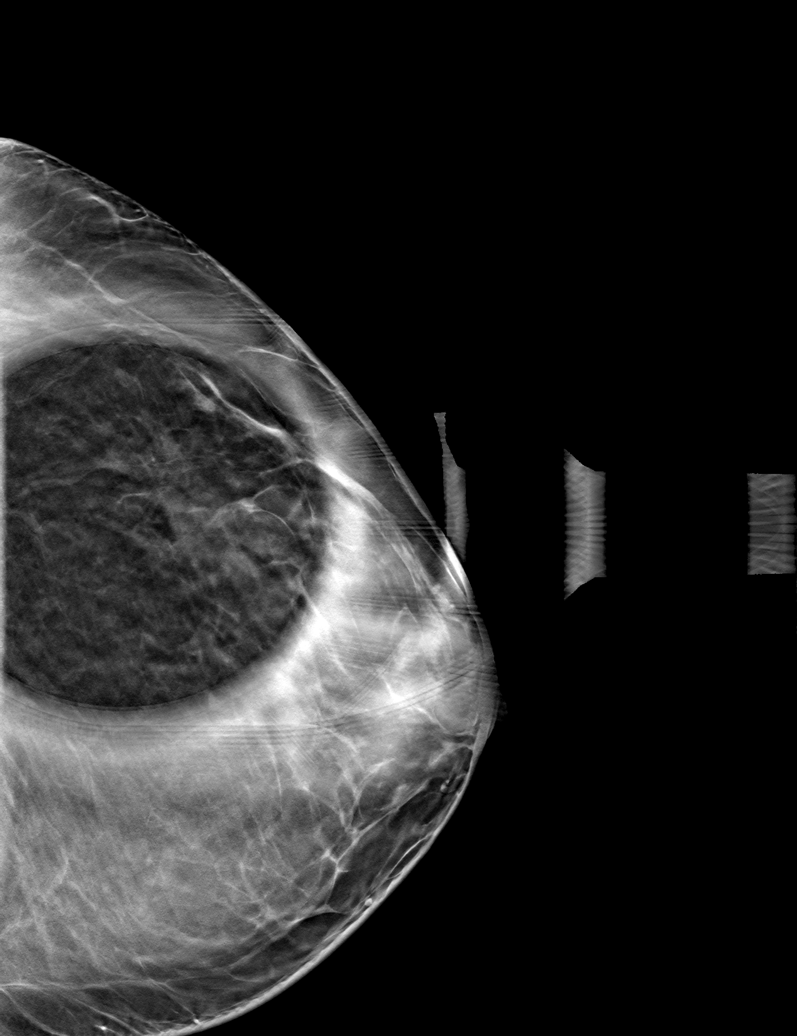

[6 of 30 positions shown; findings below may reference images not displayed]

ACR Breast Density Category b: There are scattered areas of
fibroglandular density.
FINDINGS: 2D/3D full field and spot compression views of the LEFT breast
demonstrate dispersal of the screening study asymmetry on spot
compression views. No persistent abnormality at the site of the
screening study finding identified.
IMPRESSION: No persistent abnormality at the site of the screening study
finding.

RECOMMENDATION:
Bilateral screening mammogram in 1 year.

I have discussed the findings and recommendations with the patient.
If applicable, a reminder letter will be sent to the patient
regarding the next appointment.

BI-RADS CATEGORY  1: Negative.

## 2023-03-04 ENCOUNTER — Ambulatory Visit: Payer: BC Managed Care – PPO | Admitting: Nurse Practitioner

## 2023-03-04 ENCOUNTER — Encounter: Payer: Self-pay | Admitting: Nurse Practitioner

## 2023-03-04 VITALS — BP 126/83 | HR 58 | Temp 97.8°F | Ht 66.0 in | Wt 230.0 lb

## 2023-03-04 DIAGNOSIS — E669 Obesity, unspecified: Secondary | ICD-10-CM

## 2023-03-04 DIAGNOSIS — Z6837 Body mass index (BMI) 37.0-37.9, adult: Secondary | ICD-10-CM

## 2023-03-04 DIAGNOSIS — R632 Polyphagia: Secondary | ICD-10-CM | POA: Diagnosis not present

## 2023-03-04 MED ORDER — ZEPBOUND 2.5 MG/0.5ML ~~LOC~~ SOAJ: 2.5000 mg | SUBCUTANEOUS | 0 refills | Status: DC

## 2023-03-04 NOTE — Progress Notes (Signed)
Office: [phone]  /  Fax: [phone]  WEIGHT SUMMARY AND BIOMETRICS  Weight Lost Since Last Visit: 12lb  Weight Gained Since Last Visit: 0lb   Vitals Temp: 97.8 F (36.6 C) BP: 126/83 Pulse Rate: (!) 58 SpO2: 98 %   Anthropometric Measurements Height: 5\' 6"  (1.676 m) Weight: 230 lb (104.3 kg) BMI (Calculated): 37.14 Weight at Last Visit: 242lb Weight Lost Since Last Visit: 12lb Weight Gained Since Last Visit: 0lb Starting Weight: 243lb Total Weight Loss (lbs): 13 lb (5.897 kg)   Body Composition  Body Fat %: 39.8 % Fat Mass (lbs): 91.8 lbs Muscle Mass (lbs): 132 lbs Total Body Water (lbs): 95.2 lbs Visceral Fat Rating : 11   Other Clinical Data Fasting: No Labs: No Today's Visit #: 5 Starting Date: 12/31/22     HPI  Chief Complaint: OBESITY  Carolyn Harvey is here to discuss her progress with her obesity treatment plan. She is on the the Category 3 Plan and states she is following her eating plan approximately 75-80 % of the time. She states she is exercising 45 minutes 6 days per week.   Interval History:  Since last office visit she has 12 lbs.  She is averaging around 1500 calories and 120-150 grams of protein.  Doing well with meal plan.     Pharmacotherapy for weight loss: She is currently taking Zepbound 2.5 mg for medical weight loss (3 injections)  Denies side effects.  Has helped with polyphagia and cravings.  Previous pharmacotherapy for medical weight loss:  none  Bariatric surgery:  Patient has not had bariatric surgery.   PHYSICAL EXAM:  Blood pressure 126/83, pulse (!) 58, temperature 97.8 F (36.6 C), height 5\' 6"  (1.676 m), weight 230 lb (104.3 kg), SpO2 98%. Body mass index is 37.12 kg/m.  General: She is overweight, cooperative, alert, well developed, and in no acute distress. PSYCH: Has normal mood, affect and thought process.   Extremities: No edema.  Neurologic: No gross sensory or motor deficits. No tremors or  fasciculations noted.    DIAGNOSTIC DATA REVIEWED:  BMET    Component Value Date/Time   NA 141 12/31/2022 1129   K 5.1 01/18/2023 0932   CL 101 12/31/2022 1129   CO2 24 12/31/2022 1129   GLUCOSE 94 12/31/2022 1129   GLUCOSE 79 04/13/2022 0937   BUN 14 12/31/2022 1129   CREATININE 0.89 12/31/2022 1129   CALCIUM 9.5 12/31/2022 1129   Lab Results  Component Value Date   HGBA1C 5.4 12/31/2022   HGBA1C 5.5 06/29/2017   Lab Results  Component Value Date   INSULIN 7.5 12/31/2022   Lab Results  Component Value Date   TSH 0.704 12/31/2022   CBC    Component Value Date/Time   WBC 4.6 04/13/2022 0937   RBC 4.17 04/13/2022 0937   HGB 13.1 04/13/2022 0937   HCT 39.1 04/13/2022 0937   PLT 262.0 04/13/2022 0937   MCV 93.8 04/13/2022 0937   MCHC 33.4 04/13/2022 0937   RDW 12.9 04/13/2022 0937   Iron Studies No results found for: "IRON", "TIBC", "FERRITIN", "IRONPCTSAT" Lipid Panel     Component Value Date/Time   CHOL 174 12/31/2022 1129   TRIG 84 12/31/2022 1129   HDL 51 12/31/2022 1129   CHOLHDL 3 04/13/2022 0937   VLDL 15.0 04/13/2022 0937   LDLCALC 107 (H) 12/31/2022 1129   Hepatic Function Panel     Component Value Date/Time   PROT 6.6 12/31/2022 1129   ALBUMIN 4.1 12/31/2022 1129  AST 20 12/31/2022 1129   ALT 17 12/31/2022 1129   ALKPHOS 78 12/31/2022 1129   BILITOT 0.6 12/31/2022 1129      Component Value Date/Time   TSH 0.704 12/31/2022 1129   Nutritional Lab Results  Component Value Date   VD25OH 62.5 12/31/2022     ASSESSMENT AND PLAN  TREATMENT PLAN FOR OBESITY:  Recommended Dietary Goals  Beily is currently in the action stage of change. As such, her goal is to continue weight management plan. She has agreed to the Category 3 Plan.  Behavioral Intervention  We discussed the following Behavioral Modification Strategies today: increasing lean protein intake, decreasing simple carbohydrates , increasing vegetables, increasing lower  glycemic fruits, increasing water intake, keeping healthy foods at home, continue to practice mindfulness when eating, and planning for success.  Additional resources provided today: NA  Recommended Physical Activity Goals  Letasha has been advised to work up to 150 minutes of moderate intensity aerobic activity a week and strengthening exercises 2-3 times per week for cardiovascular health, weight loss maintenance and preservation of muscle mass.   She has agreed to Continue current level of physical activity    Pharmacotherapy We discussed various medication options to help Sybel with her weight loss efforts and we both agreed to continue Zepbound 2.5 mg.  Side effects discussed..  ASSOCIATED CONDITIONS ADDRESSED TODAY  Action/Plan  Polyphagia -     Zepbound; Inject 2.5 mg into the skin once a week.  Dispense: 2 mL; Refill: 0  Intensive lifestyle modifications are the first line treatment for this issue. We discussed several lifestyle modifications today and she will continue to work on diet, exercise and weight loss efforts. Orders and follow up as documented in patient record.  Counseling Polyphagia is excessive hunger. Causes can include: low blood sugars, hypERthyroidism, PMS, lack of sleep, stress, insulin resistance, diabetes, certain medications, and diets that are deficient in protein and fiber.    Generalized obesity  BMI 37.0-37.9, adult         Return in about 3 weeks (around 03/25/2023).Marland Kitchen She was informed of the importance of frequent follow up visits to maximize her success with intensive lifestyle modifications for her multiple health conditions.   ATTESTASTION STATEMENTS:  Reviewed by clinician on day of visit: allergies, medications, problem list, medical history, surgical history, family history, social history, and previous encounter notes.     Theodis Sato. Jaina Morin FNP-C

## 2023-03-04 NOTE — Patient Instructions (Signed)

## 2023-03-08 ENCOUNTER — Ambulatory Visit: Payer: BC Managed Care – PPO | Admitting: Bariatrics

## 2023-03-09 ENCOUNTER — Encounter: Payer: Self-pay | Admitting: Nurse Practitioner

## 2023-03-18 ENCOUNTER — Encounter (INDEPENDENT_AMBULATORY_CARE_PROVIDER_SITE_OTHER): Payer: Self-pay

## 2023-04-04 ENCOUNTER — Telehealth: Payer: BC Managed Care – PPO | Admitting: Nurse Practitioner

## 2023-04-04 DIAGNOSIS — U071 COVID-19: Secondary | ICD-10-CM | POA: Diagnosis not present

## 2023-04-04 NOTE — Progress Notes (Signed)
Virtual Visit Consent   Carolyn Harvey, you are scheduled for a virtual visit with a [facility] provider today. Just as with appointments in the office, your consent must be obtained to participate. Your consent will be active for this visit and any virtual visit you may have with one of our providers in the next 365 days. If you have a MyChart account, a copy of this consent can be sent to you electronically.  As this is a virtual visit, video technology does not allow for your provider to perform a traditional examination. This may limit your provider's ability to fully assess your condition. If your provider identifies any concerns that need to be evaluated in person or the need to arrange testing (such as labs, EKG, etc.), we will make arrangements to do so. Although advances in technology are sophisticated, we cannot ensure that it will always work on either your end or our end. If the connection with a video visit is poor, the visit may have to be switched to a telephone visit. With either a video or telephone visit, we are not always able to ensure that we have a secure connection.  By engaging in this virtual visit, you consent to the provision of healthcare and authorize for your insurance to be billed (if applicable) for the services provided during this visit. Depending on your insurance coverage, you may receive a charge related to this service.  I need to obtain your verbal consent now. Are you willing to proceed with your visit today? Carolyn Harvey has provided verbal consent on 04/04/2023 for a virtual visit (video or telephone). Claiborne Rigg, NP  Date: 04/04/2023 6:08 PM  Virtual Visit via Video Note   I, Claiborne Rigg, connected with  Carolyn Harvey  ([id number], May 14, 1976) on 04/04/23 at  6:00 PM EDT by a video-enabled telemedicine application and verified that I am speaking with the correct person using two identifiers.  Location: Patient: Virtual Visit Location Patient:  Home Provider: Virtual Visit Location Provider: Home Office   I discussed the limitations of evaluation and management by telemedicine and the availability of in person appointments. The patient expressed understanding and agreed to proceed.    History of Present Illness: Carolyn Harvey is a 47 y.o. who identifies as a female who was assigned female at birth, and is being seen today for COVID positive test.  [name] received the COVID vaccine this weekend. Unfortunately she also tested positive for COVID today. She is currently experiencing chest congestion, sinus congestion and drainage.  She has been taking OTC robitussin.    Problems:  Patient Active Problem List   Diagnosis Date Noted   Generalized obesity 12/31/2022   Other fatigue 12/31/2022   SOB (shortness of breath) on exertion 12/31/2022   BMI 39.0-39.9,adult 12/31/2022   Family history of diabetes mellitus 12/31/2022   Vitamin D deficiency 12/31/2022   Health care maintenance 12/31/2022   Essential hypertension 12/31/2022   Anxiety and depression 03/22/2020   Family history of colon cancer in mother 03/22/2020   Hypothyroidism due to Hashimoto's thyroiditis 06/17/2018   Hypertension 05/21/2017    Allergies: No Known Allergies Medications:  Current Outpatient Medications:    Bacillus Coagulans-Inulin (ALIGN PREBIOTIC-PROBIOTIC PO), Take by mouth., Disp: , Rfl:    cetirizine (ZYRTEC) 10 MG tablet, Take 1 tablet by mouth daily., Disp: , Rfl:    FLUoxetine (PROZAC) 10 MG capsule, TAKE ONE CAPSULE BY MOUTH DAILY, Disp: 90 capsule, Rfl: 3   FLUoxetine (PROZAC) 20 MG  capsule, TAKE ONE CAPSULE BY MOUTH DAILY, Disp: 90 capsule, Rfl: 3   fluticasone (FLONASE) 50 MCG/ACT nasal spray, Place 2 sprays daily into both nostrils., Disp: 16 g, Rfl: 6   levonorgestrel (MIRENA) 20 MCG/24HR IUD, 1 each by Intrauterine route once., Disp: , Rfl:    levothyroxine (SYNTHROID) 200 MCG tablet, TAKE 1 TABLET BY MOUTH DAILY, Disp: 90 tablet,  Rfl: 2   Multiple Vitamin (MULTIVITAMIN) tablet, Take 1 tablet by mouth daily., Disp: , Rfl:    Multiple Vitamins-Minerals (WOMENS MULTIVITAMIN + COLLAGEN PO), Take by mouth., Disp: , Rfl:    tirzepatide (ZEPBOUND) 2.5 MG/0.5ML Pen, Inject 2.5 mg into the skin once a week., Disp: 2 mL, Rfl: 0  Observations/Objective: Patient is well-developed, well-nourished in no acute distress.  Resting comfortably at home.  Head is normocephalic, atraumatic.  No labored breathing.  Speech is clear and coherent with logical content.  Patient is alert and oriented at baseline.    Assessment and Plan: 1. Positive self-administered antigen test for COVID-19 Conservative treatment only at this time.  Please keep well-hydrated and get plenty of rest. Start a saline nasal rinse to flush out your nasal passages. You can use plain Mucinex  or Robitussin to help thin congestion. If you have a humidifier, you can use this daily as needed.    You are to wear a mask for 5 days from onset of your symptoms.  After day 5, if you have had no fever and you are feeling better with NO symptoms, you can end masking. Keep in mind you can be contagious 10 days from the onset of symptoms  After day 5 if you have a fever or are having significant symptoms, please wear your mask for full 10 days.   If you note any worsening of symptoms, any significant shortness of breath or any chest pain, please seek ER evaluation ASAP.  Please do not delay care!    If you note any worsening of symptoms, any significant shortness of breath or any chest pain, please seek ER evaluation ASAP.  Please do not delay care!   Follow Up Instructions: I discussed the assessment and treatment plan with the patient. The patient was provided an opportunity to ask questions and all were answered. The patient agreed with the plan and demonstrated an understanding of the instructions.  A copy of instructions were sent to the patient via MyChart unless  otherwise noted below.     The patient was advised to call back or seek an in-person evaluation if the symptoms worsen or if the condition fails to improve as anticipated.  Time:  I spent 12 with the patient via telehealth technology discussing the above problems/concerns.    Claiborne Rigg, NP

## 2023-04-04 NOTE — Patient Instructions (Signed)
  Myles Gip, thank you for joining Claiborne Rigg, NP for today's virtual visit.  While this provider is not your primary care provider (PCP), if your PCP is located in our provider database this encounter information will be shared with them immediately following your visit.   A [facility] MyChart account gives you access to today's visit and all your visits, tests, and labs performed at [facility] " click here if you don't have a [facility] MyChart account or go to [email or web address]  Consent: (Patient) Carolyn Harvey provided verbal consent for this virtual visit at the beginning of the encounter.  Current Medications:  Current Outpatient Medications:    Bacillus Coagulans-Inulin (ALIGN PREBIOTIC-PROBIOTIC PO), Take by mouth., Disp: , Rfl:    cetirizine (ZYRTEC) 10 MG tablet, Take 1 tablet by mouth daily., Disp: , Rfl:    FLUoxetine (PROZAC) 10 MG capsule, TAKE ONE CAPSULE BY MOUTH DAILY, Disp: 90 capsule, Rfl: 3   FLUoxetine (PROZAC) 20 MG capsule, TAKE ONE CAPSULE BY MOUTH DAILY, Disp: 90 capsule, Rfl: 3   fluticasone (FLONASE) 50 MCG/ACT nasal spray, Place 2 sprays daily into both nostrils., Disp: 16 g, Rfl: 6   levonorgestrel (MIRENA) 20 MCG/24HR IUD, 1 each by Intrauterine route once., Disp: , Rfl:    levothyroxine (SYNTHROID) 200 MCG tablet, TAKE 1 TABLET BY MOUTH DAILY, Disp: 90 tablet, Rfl: 2   Multiple Vitamin (MULTIVITAMIN) tablet, Take 1 tablet by mouth daily., Disp: , Rfl:    Multiple Vitamins-Minerals (WOMENS MULTIVITAMIN + COLLAGEN PO), Take by mouth., Disp: , Rfl:    tirzepatide (ZEPBOUND) 2.5 MG/0.5ML Pen, Inject 2.5 mg into the skin once a week., Disp: 2 mL, Rfl: 0   Medications ordered in this encounter:  No orders of the defined types were placed in this encounter.    *If you need refills on other medications prior to your next appointment, please contact your pharmacy*  Follow-Up: Call back or seek an in-person evaluation if the symptoms  worsen or if the condition fails to improve as anticipated.  [facility] [phone]  Other Instructions Follow up if symptoms worsen within the next 4 days.    If you have been instructed to have an in-person evaluation today at a local Urgent Care facility, please use the link below. It will take you to a list of all of our available [facility], including address, phone number and hours of operation. Please do not delay care.  [facility]  If you or a family member do not have a primary care provider, use the link below to schedule a visit and establish care. When you choose a [facility] primary care physician or advanced practice provider, you gain a long-term partner in health. Find a Primary Care Provider  Learn more about [facility] in-office and virtual care options:  - Get Care Now

## 2023-04-05 ENCOUNTER — Encounter: Payer: Self-pay | Admitting: Nurse Practitioner

## 2023-04-07 ENCOUNTER — Telehealth: Payer: BC Managed Care – PPO | Admitting: Nurse Practitioner

## 2023-04-07 DIAGNOSIS — E669 Obesity, unspecified: Secondary | ICD-10-CM

## 2023-04-07 DIAGNOSIS — Z6837 Body mass index (BMI) 37.0-37.9, adult: Secondary | ICD-10-CM | POA: Diagnosis not present

## 2023-04-07 DIAGNOSIS — R632 Polyphagia: Secondary | ICD-10-CM

## 2023-04-07 MED ORDER — ZEPBOUND 2.5 MG/0.5ML ~~LOC~~ SOAJ: 2.5000 mg | SUBCUTANEOUS | 0 refills | Status: DC

## 2023-04-07 NOTE — Progress Notes (Signed)
  TeleHealth Visit:  This visit was completed with telemedicine (audio/video) technology. Tranise has verbally consented to this TeleHealth visit. The patient is located at home, the provider is located at home. The participants in this visit include the listed provider and patient. The visit was conducted today via MyChart video.  OBESITY Carolyn Harvey is here via video visit to discuss her progress with her obesity treatment plan along with follow-up of her obesity related diagnoses.    Interim History:  Patient currently has Covid.  Hasn't been able to follow plan due to sickness but once she is feeling better she plans to get back on track.    Current exercise: currently has Covid and hasn't been able to exercise.  Was exercising 45 mins 6 days per week-cardio and resistance training.      Eating all of the prescribed protein: yes Skipping meals: No Drinking adequate water: Yes Drinking sugar sweetened beverages: no Hunger controlled: moderately controlled. Cravings controlled:  moderately controlled.  Meeting protein goals:  Yes Meeting calorie goals:  Yes   Pharmacotherapy: Modesta is on Zepbound 2.5 mg SQ weekly Adverse side effects: None Hunger is moderately controlled.  Cravings are moderately controlled.   Assessment/Plan:  1. Polyphagia Refill Zepbound 2.5mg .  Side effects discussed  2. Generalized obesity  3. BMI 37  Pharmacotherapy Plan Continue Zepbound 2.5mg .  side effects discussed  Geniyah is currently in the action stage of change. As such, her goal is to continue with weight loss efforts.  She has agreed to the Category 3 plan.  Exercise goals: as tolerated.  Behavioral modification strategies: increasing lean protein intake, decreasing simple carbohydrates , no meal skipping, meal planning , increase water intake, and planning for success.  Vernestine has agreed to follow-up with our clinic in 4 weeks.  No orders of the defined types were placed in this  encounter.   Medications Discontinued During This Encounter  Medication Reason   tirzepatide (ZEPBOUND) 2.5 MG/0.5ML Pen Reorder     Meds ordered this encounter  Medications   tirzepatide (ZEPBOUND) 2.5 MG/0.5ML Pen    Sig: Inject 2.5 mg into the skin once a week.    Dispense:  2 mL    Refill:  0      Objective:   VITALS: Per patient if applicable, see vitals. GENERAL: Alert and in no acute distress. CARDIOPULMONARY: No increased WOB. Speaking in clear sentences.  PSYCH: Pleasant and cooperative. Speech normal rate and rhythm. Affect is appropriate. Insight and judgement are appropriate. Attention is focused, linear, and appropriate.  NEURO: Oriented as arrived to appointment on time with no prompting.   Attestation Statements:   Reviewed by clinician on day of visit: allergies, medications, problem list, medical history, surgical history, family history, social history, and previous encounter notes.   This was prepared with the assistance of Engineer, civil (consulting).  Occasional wrong-word or sound-a-like substitutions may have occurred due to the inherent limitations of voice recognition software.

## 2023-04-15 ENCOUNTER — Encounter: Payer: BC Managed Care – PPO | Admitting: Physician Assistant

## 2023-04-16 ENCOUNTER — Encounter: Payer: Self-pay | Admitting: Physician Assistant

## 2023-04-16 ENCOUNTER — Ambulatory Visit (INDEPENDENT_AMBULATORY_CARE_PROVIDER_SITE_OTHER): Payer: BC Managed Care – PPO | Admitting: Physician Assistant

## 2023-04-16 VITALS — BP 110/70 | HR 65 | Temp 98.0°F | Ht 66.0 in | Wt 226.2 lb

## 2023-04-16 DIAGNOSIS — E669 Obesity, unspecified: Secondary | ICD-10-CM

## 2023-04-16 DIAGNOSIS — E063 Autoimmune thyroiditis: Secondary | ICD-10-CM | POA: Diagnosis not present

## 2023-04-16 DIAGNOSIS — Z0000 Encounter for general adult medical examination without abnormal findings: Secondary | ICD-10-CM | POA: Diagnosis not present

## 2023-04-16 DIAGNOSIS — E038 Other specified hypothyroidism: Secondary | ICD-10-CM | POA: Diagnosis not present

## 2023-04-16 NOTE — Progress Notes (Signed)
Subjective:    Carolyn Harvey is a 47 y.o. female and is here for a comprehensive physical exam.  HPI  Health Maintenance Due  Topic Date Due   Hepatitis C Screening  Never done   MAMMOGRAM  12/20/2022    Acute Concerns: No acute concerns discussed this visit.  Chronic Issues: Hypothyroidism Patient is compliant with daily 200 mcg synthroid with no adverse effects. Continues to see endo  Obesity Patient is complaint with 2.5 mg/0.5 ml zepbound weekly with no adverse side effects. She participates with healthy weight and wellness.  Depression Patient is complaint with daily 30 mg prozac with no adverse side effects.   Health Maintenance: Colonoscopy -- last completed on 08/06/2020. Mammogram -- last completed on 12/19/2021. PAP -- last completed on 07/04/2019. Diet -- She maintains a fair diet. Exercise -- She participates in regular exercise. Patient strength trains.  Sleep habits -- Patient reports no trouble with sleeping. Mood -- She is in a stable mood this visit.  UTD with dentist? - She is UTD with dental care. UTD with eye doctor? - She is UTD with vision care.  Weight history: Wt Readings from Last 10 Encounters:  04/16/23 226 lb 4 oz (102.6 kg)  03/04/23 230 lb (104.3 kg)  02/11/23 242 lb (109.8 kg)  02/01/23 239 lb (108.4 kg)  01/18/23 240 lb (108.9 kg)  12/31/22 243 lb (110.2 kg)  12/10/22 242 lb (109.8 kg)  07/02/22 230 lb 6.4 oz (104.5 kg)  04/13/22 226 lb 6.4 oz (102.7 kg)  07/02/21 226 lb 6.4 oz (102.7 kg)   Body mass index is 36.52 kg/m. No LMP recorded. (Menstrual status: IUD).  Alcohol use:  reports current alcohol use of about 2.0 standard drinks of alcohol per week.  Tobacco use:  Tobacco Use: Low Risk  (04/16/2023)   Patient History    Smoking Tobacco Use: Never    Smokeless Tobacco Use: Never    Passive Exposure: Not on file   Eligible for lung cancer screening? no     04/16/2023    1:48 PM  Depression screen PHQ 2/9   Decreased Interest 0  Down, Depressed, Hopeless 0  PHQ - 2 Score 0     Other providers/specialists: Patient Care Team: Jarold Motto, [place] as PCP - General (Physician Assistant)    PMHx, SurgHx, SocialHx, Medications, and Allergies were reviewed in the Visit Navigator and updated as appropriate.   Past Medical History:  Diagnosis Date   Allergy    Anxiety    Back pain    Back pain    Depression    Gallbladder problem    Health care maintenance 12/31/2022   Hypertension    07/23/20 lost 100# and no longer needs meds x 6 months   Swelling of lower extremity    Thyroid disease    Vitamin D deficiency      Past Surgical History:  Procedure Laterality Date   CHOLECYSTECTOMY     TONSILLECTOMY     WISDOM TOOTH EXTRACTION       Family History  Problem Relation Age of Onset   Asthma Mother    Miscarriages / Stillbirths Mother    Hypothyroidism Mother    Colon cancer Mother 32       mets to brain   Colon polyps Mother    Cancer Mother    Obesity Mother    High blood pressure Mother    Diabetes Father    Hypertension Father    Pulmonary embolism Father  Obesity Father    Miscarriages / [place] Sister    Fibromyalgia Sister    Hypothyroidism Sister    Arthritis Sister    Obesity Sister    Stroke Maternal Grandmother    CVA Maternal Grandfather    Alzheimer's disease Paternal Grandmother    Stroke Paternal Grandfather    Stomach cancer Paternal Uncle    Breast cancer Neg Hx    Esophageal cancer Neg Hx    Rectal cancer Neg Hx     Social History   Tobacco Use   Smoking status: Never   Smokeless tobacco: Never  Vaping Use   Vaping status: Never Used  Substance Use Topics   Alcohol use: Yes    Alcohol/week: 2.0 standard drinks of alcohol    Types: 2 Cans of beer per week   Drug use: No    Review of Systems:   Review of Systems  Constitutional:  Negative for chills, fever, malaise/fatigue and weight loss.  HENT:  Negative for hearing loss,  sinus pain and sore throat.   Respiratory:  Negative for cough and hemoptysis.   Cardiovascular:  Negative for chest pain, palpitations, leg swelling and PND.  Gastrointestinal:  Negative for abdominal pain, constipation, diarrhea, heartburn, nausea and vomiting.  Genitourinary:  Negative for dysuria, frequency and urgency.  Musculoskeletal:  Negative for back pain, myalgias and neck pain.  Skin:  Negative for itching and rash.  Endo/Heme/Allergies:  Negative for polydipsia.  Psychiatric/Behavioral:  Negative for depression. The patient is not nervous/anxious.     Objective:   BP 110/70 (BP Location: Left Arm, Patient Position: Sitting, Cuff Size: Large)   Pulse 65   Temp 98 F (36.7 C) (Temporal)   Ht 5\' 6"  (1.676 m)   Wt 226 lb 4 oz (102.6 kg)   SpO2 97%   BMI 36.52 kg/m  Body mass index is 36.52 kg/m.   General Appearance:    Alert, cooperative, no distress, appears stated age  Head:    Normocephalic, without obvious abnormality, atraumatic  Eyes:    PERRL, conjunctiva/corneas clear, EOM's intact, fundi    benign, both eyes  Ears:    Normal TM's and external ear canals, both ears  Nose:   Nares normal, septum midline, mucosa normal, no drainage    or sinus tenderness  Throat:   Lips, mucosa, and tongue normal; teeth and gums normal  Neck:   Supple, symmetrical, trachea midline, no adenopathy;    thyroid:  no enlargement/tenderness/nodules; no carotid   bruit or JVD  Back:     Symmetric, no curvature, ROM normal, no CVA tenderness  Lungs:     Clear to auscultation bilaterally, respirations unlabored  Chest Wall:    No tenderness or deformity   Heart:    Regular rate and rhythm, S1 and S2 normal, no murmur, rub or gallop  Breast Exam:    Deferred  Abdomen:     Soft, non-tender, bowel sounds active all four quadrants,    no masses, no organomegaly  Genitalia:    Deferred  Extremities:   Extremities normal, atraumatic, no cyanosis or edema  Pulses:   2+ and symmetric all  extremities  Skin:   Skin color, texture, turgor normal, no rashes or lesions  Lymph nodes:   Cervical, supraclavicular, and axillary nodes normal  Neurologic:   CNII-XII intact, normal strength, sensation and reflexes    throughout    Assessment/Plan:   Routine physical examination Today patient counseled on age appropriate routine health concerns for screening  and prevention, each reviewed and up to date or declined. Immunizations reviewed and up to date or declined. Labs ordered and reviewed. Risk factors for depression reviewed and negative. Hearing function and visual acuity are intact. ADLs screened and addressed as needed. Functional ability and level of safety reviewed and appropriate. Education, counseling and referrals performed based on assessed risks today. Patient provided with a copy of personalized plan for preventive services.  Generalized obesity Management per [facility]  Hypothyroidism due to Hashimoto's thyroiditis Stable; management per endo     I,Verona Buck,acting as a scribe for [employer], PA.,have documented all relevant documentation on the behalf of Jarold Motto, PA,as directed by  Jarold Motto, PA while in the presence of Jarold Motto, [place].  I, Jarold Motto, [place], have reviewed all documentation for this visit. The documentation on 04/16/23 for the exam, diagnosis, procedures, and orders are all accurate and complete.  Jarold Motto, PA-C [facility]

## 2023-04-20 ENCOUNTER — Encounter: Payer: Self-pay | Admitting: Physician Assistant

## 2023-05-04 ENCOUNTER — Ambulatory Visit: Payer: BC Managed Care – PPO | Admitting: Nurse Practitioner

## 2023-05-04 ENCOUNTER — Encounter: Payer: Self-pay | Admitting: Nurse Practitioner

## 2023-05-04 VITALS — BP 135/84 | HR 62 | Temp 97.8°F | Ht 66.0 in | Wt 220.0 lb

## 2023-05-04 DIAGNOSIS — R632 Polyphagia: Secondary | ICD-10-CM | POA: Diagnosis not present

## 2023-05-04 DIAGNOSIS — E669 Obesity, unspecified: Secondary | ICD-10-CM | POA: Diagnosis not present

## 2023-05-04 DIAGNOSIS — Z6835 Body mass index (BMI) 35.0-35.9, adult: Secondary | ICD-10-CM

## 2023-05-04 MED ORDER — TIRZEPATIDE-WEIGHT MANAGEMENT 5 MG/0.5ML ~~LOC~~ SOLN: 5.0000 mg | SUBCUTANEOUS | 0 refills | Status: DC

## 2023-05-04 NOTE — Progress Notes (Signed)
Office: [phone]  /  Fax: [phone]  WEIGHT SUMMARY AND BIOMETRICS  Weight Lost Since Last Visit: 10lb  Weight Gained Since Last Visit: 0lb   Vitals Temp: 97.8 F (36.6 C) BP: 135/84 Pulse Rate: 62 SpO2: 99 %   Anthropometric Measurements Height: 5\' 6"  (1.676 m) Weight: 220 lb (99.8 kg) BMI (Calculated): 35.53 Weight at Last Visit: 230lb Weight Lost Since Last Visit: 10lb Weight Gained Since Last Visit: 0lb Starting Weight: 243lb Total Weight Loss (lbs): 23 lb (10.4 kg)   Body Composition  Body Fat %: 42.2 % Fat Mass (lbs): 93 lbs Muscle Mass (lbs): 121 lbs Total Body Water (lbs): 94.8 lbs Visceral Fat Rating : 11   Other Clinical Data Fasting: No Labs: No Today's Visit #: 7 Starting Date: 12/31/22     HPI  Chief Complaint: OBESITY  Carolyn Harvey is here to discuss her progress with her obesity treatment plan. She is on the the Category 3 Plan and states she is following her eating plan approximately 70 % of the time. She states she is exercising 45-60 minutes 5 days per week.   Interval History:  Since last office visit she has lost 10 pounds.  Still struggling with polyphagia and cravings. Wanting to snack more.    Pharmacotherapy for weight loss: She is currently taking Zepbound 2.5mg  for medical weight loss.  Denies side effects.  Notes polyphagia and cravings.    Previous pharmacotherapy for medical weight loss:  None  Bariatric surgery:  Patient has not had bariatric surgery.    PHYSICAL EXAM:  Blood pressure 135/84, pulse 62, temperature 97.8 F (36.6 C), height 5\' 6"  (1.676 m), weight 220 lb (99.8 kg), SpO2 99%. Body mass index is 35.51 kg/m.  General: She is overweight, cooperative, alert, well developed, and in no acute distress. PSYCH: Has normal mood, affect and thought process.   Extremities: No edema.  Neurologic: No gross sensory or motor deficits. No tremors or fasciculations noted.    DIAGNOSTIC DATA REVIEWED:  BMET     Component Value Date/Time   NA 141 12/31/2022 1129   K 5.1 01/18/2023 0932   CL 101 12/31/2022 1129   CO2 24 12/31/2022 1129   GLUCOSE 94 12/31/2022 1129   GLUCOSE 79 04/13/2022 0937   BUN 14 12/31/2022 1129   CREATININE 0.89 12/31/2022 1129   CALCIUM 9.5 12/31/2022 1129   Lab Results  Component Value Date   HGBA1C 5.4 12/31/2022   HGBA1C 5.5 06/29/2017   Lab Results  Component Value Date   INSULIN 7.5 12/31/2022   Lab Results  Component Value Date   TSH 0.704 12/31/2022   CBC    Component Value Date/Time   WBC 4.6 04/13/2022 0937   RBC 4.17 04/13/2022 0937   HGB 13.1 04/13/2022 0937   HCT 39.1 04/13/2022 0937   PLT 262.0 04/13/2022 0937   MCV 93.8 04/13/2022 0937   MCHC 33.4 04/13/2022 0937   RDW 12.9 04/13/2022 0937   Iron Studies No results found for: "IRON", "TIBC", "FERRITIN", "IRONPCTSAT" Lipid Panel     Component Value Date/Time   CHOL 174 12/31/2022 1129   TRIG 84 12/31/2022 1129   HDL 51 12/31/2022 1129   CHOLHDL 3 04/13/2022 0937   VLDL 15.0 04/13/2022 0937   LDLCALC 107 (H) 12/31/2022 1129   Hepatic Function Panel     Component Value Date/Time   PROT 6.6 12/31/2022 1129   ALBUMIN 4.1 12/31/2022 1129   AST 20 12/31/2022 1129   ALT 17 12/31/2022 1129  ALKPHOS 78 12/31/2022 1129   BILITOT 0.6 12/31/2022 1129      Component Value Date/Time   TSH 0.704 12/31/2022 1129   Nutritional Lab Results  Component Value Date   VD25OH 62.5 12/31/2022     ASSESSMENT AND PLAN  TREATMENT PLAN FOR OBESITY:  Recommended Dietary Goals  Reisa is currently in the action stage of change. As such, her goal is to continue weight management plan. She has agreed to the Category 3 Plan.  Behavioral Intervention  We discussed the following Behavioral Modification Strategies today: increasing lean protein intake, decreasing simple carbohydrates , increasing vegetables, increasing lower glycemic fruits, increasing water intake, work on meal planning and  preparation, reading food labels , keeping healthy foods at home, continue to practice mindfulness when eating, and planning for success.  Additional resources provided today: NA  Recommended Physical Activity Goals  Ron has been advised to work up to 150 minutes of moderate intensity aerobic activity a week and strengthening exercises 2-3 times per week for cardiovascular health, weight loss maintenance and preservation of muscle mass.   She has agreed to Continue current level of physical activity    Pharmacotherapy We discussed various medication options to help Ayen with her weight loss efforts and we both agreed to increase Zepbound 5mg .  Side effects discussed.  ASSOCIATED CONDITIONS ADDRESSED TODAY  Action/Plan  Polyphagia -     Tirzepatide-Weight Management; Inject 5 mg into the skin once a week.  Dispense: 2 mL; Refill: 0  Generalized obesity -     Tirzepatide-Weight Management; Inject 5 mg into the skin once a week.  Dispense: 2 mL; Refill: 0  BMI 35.0-35.9,adult      Return in about 4 weeks (around 06/01/2023).Marland Kitchen She was informed of the importance of frequent follow up visits to maximize her success with intensive lifestyle modifications for her multiple health conditions.   ATTESTASTION STATEMENTS:  Reviewed by clinician on day of visit: allergies, medications, problem list, medical history, surgical history, family history, social history, and previous encounter notes.     Theodis Sato. Shailyn Weyandt FNP-C

## 2023-05-31 ENCOUNTER — Ambulatory Visit: Payer: BC Managed Care – PPO | Admitting: Nurse Practitioner

## 2023-05-31 ENCOUNTER — Encounter: Payer: Self-pay | Admitting: Nurse Practitioner

## 2023-05-31 ENCOUNTER — Other Ambulatory Visit: Payer: Self-pay | Admitting: Physician Assistant

## 2023-05-31 VITALS — BP 121/76 | HR 59 | Temp 97.8°F | Ht 66.0 in | Wt 216.0 lb

## 2023-05-31 DIAGNOSIS — R632 Polyphagia: Secondary | ICD-10-CM

## 2023-05-31 DIAGNOSIS — Z6834 Body mass index (BMI) 34.0-34.9, adult: Secondary | ICD-10-CM | POA: Diagnosis not present

## 2023-05-31 DIAGNOSIS — E669 Obesity, unspecified: Secondary | ICD-10-CM | POA: Diagnosis not present

## 2023-05-31 MED ORDER — TIRZEPATIDE-WEIGHT MANAGEMENT 5 MG/0.5ML ~~LOC~~ SOLN
5.0000 mg | SUBCUTANEOUS | 0 refills | Status: DC
Start: 2023-05-31 — End: 2023-07-19

## 2023-05-31 NOTE — Progress Notes (Signed)
Office: [phone]  /  Fax: [phone]  WEIGHT SUMMARY AND BIOMETRICS  Weight Lost Since Last Visit: 4lb  Weight Gained Since Last Visit: 0lb   Vitals Temp: 97.8 F (36.6 C) BP: 121/76 Pulse Rate: (!) 59 SpO2: 99 %   Anthropometric Measurements Height: 5\' 6"  (1.676 m) Weight: 216 lb (98 kg) BMI (Calculated): 34.88 Weight at Last Visit: 220lb Weight Lost Since Last Visit: 4lb Weight Gained Since Last Visit: 0lb Starting Weight: 243lb Total Weight Loss (lbs): 27 lb (12.2 kg)   Body Composition  Body Fat %: 42 % Fat Mass (lbs): 91 lbs Muscle Mass (lbs): 119.2 lbs Total Body Water (lbs): 91.2 lbs Visceral Fat Rating : 10   Other Clinical Data Fasting: No Labs: No Today's Visit #: 8 Starting Date: 12/31/22     HPI  Chief Complaint: OBESITY  Carolyn Harvey is here to discuss her progress with her obesity treatment plan. She is on the the Category 3 Plan and states she is following her eating plan approximately 70 % of the time. She states she is exercising 45 minutes 6 days per week.   Interval History:  Since last office visit she has lost 4 pounds.  Her husband had surgery last week and has been staying home to help care for him. Finds she does with with Zepbound 5mg  until the day the injection is due.  Finds increase hunger and cravings on the day it's due.  She is drinking water and doing more resistance training.    Pharmacotherapy for weight loss: She is currently taking Zepbound 5mg  for medical weight loss.  Denies side effects.    Previous pharmacotherapy for medical weight loss:  None  Bariatric surgery:  Patient has not had bariatric surgery.       PHYSICAL EXAM:  Blood pressure 121/76, pulse (!) 59, temperature 97.8 F (36.6 C), height 5\' 6"  (1.676 m), weight 216 lb (98 kg), SpO2 99%. Body mass index is 34.86 kg/m.  General: She is overweight, cooperative, alert, well developed, and in no acute distress. PSYCH: Has normal mood, affect and  thought process.   Extremities: No edema.  Neurologic: No gross sensory or motor deficits. No tremors or fasciculations noted.    DIAGNOSTIC DATA REVIEWED:  BMET    Component Value Date/Time   NA 141 12/31/2022 1129   K 5.1 01/18/2023 0932   CL 101 12/31/2022 1129   CO2 24 12/31/2022 1129   GLUCOSE 94 12/31/2022 1129   GLUCOSE 79 04/13/2022 0937   BUN 14 12/31/2022 1129   CREATININE 0.89 12/31/2022 1129   CALCIUM 9.5 12/31/2022 1129   Lab Results  Component Value Date   HGBA1C 5.4 12/31/2022   HGBA1C 5.5 06/29/2017   Lab Results  Component Value Date   INSULIN 7.5 12/31/2022   Lab Results  Component Value Date   TSH 0.704 12/31/2022   CBC    Component Value Date/Time   WBC 4.6 04/13/2022 0937   RBC 4.17 04/13/2022 0937   HGB 13.1 04/13/2022 0937   HCT 39.1 04/13/2022 0937   PLT 262.0 04/13/2022 0937   MCV 93.8 04/13/2022 0937   MCHC 33.4 04/13/2022 0937   RDW 12.9 04/13/2022 0937   Iron Studies No results found for: "IRON", "TIBC", "FERRITIN", "IRONPCTSAT" Lipid Panel     Component Value Date/Time   CHOL 174 12/31/2022 1129   TRIG 84 12/31/2022 1129   HDL 51 12/31/2022 1129   CHOLHDL 3 04/13/2022 0937   VLDL 15.0 04/13/2022 0937  LDLCALC 107 (H) 12/31/2022 1129   Hepatic Function Panel     Component Value Date/Time   PROT 6.6 12/31/2022 1129   ALBUMIN 4.1 12/31/2022 1129   AST 20 12/31/2022 1129   ALT 17 12/31/2022 1129   ALKPHOS 78 12/31/2022 1129   BILITOT 0.6 12/31/2022 1129      Component Value Date/Time   TSH 0.704 12/31/2022 1129   Nutritional Lab Results  Component Value Date   VD25OH 62.5 12/31/2022     ASSESSMENT AND PLAN  TREATMENT PLAN FOR OBESITY:  Recommended Dietary Goals  Kamirah is currently in the action stage of change. As such, her goal is to continue weight management plan. She has agreed to the Category 3 Plan.  Behavioral Intervention  We discussed the following Behavioral Modification Strategies today:  continue to work on maintaining a reduced calorie state, getting the recommended amount of protein, incorporating whole foods, making healthy choices, staying well hydrated and practicing mindfulness when eating..  Additional resources provided today: NA  Recommended Physical Activity Goals  Jiana has been advised to work up to 150 minutes of moderate intensity aerobic activity a week and strengthening exercises 2-3 times per week for cardiovascular health, weight loss maintenance and preservation of muscle mass.   She has agreed to Continue current level of physical activity    Pharmacotherapy We discussed various medication options to help Tequisha with her weight loss efforts and we both agreed to continue Zepbound 5mg .  Side effects discussed.  ASSOCIATED CONDITIONS ADDRESSED TODAY  Action/Plan  Polyphagia -     Continue Tirzepatide-Weight Management; Inject 5 mg into the skin once a week.  Dispense: 6 mL; Refill: 0. Side effects discussed.    Generalized obesity -     Tirzepatide-Weight Management; Inject 5 mg into the skin once a week.  Dispense: 6 mL; Refill: 0  BMI 34.0-34.9,adult      Will obtain labs at next visit if not obtained at endo's appt on 07/07/23   Return in about 7 weeks (around 07/19/2023).Marland Kitchen She was informed of the importance of frequent follow up visits to maximize her success with intensive lifestyle modifications for her multiple health conditions.   ATTESTASTION STATEMENTS:  Reviewed by clinician on day of visit: allergies, medications, problem list, medical history, surgical history, family history, social history, and previous encounter notes.     Theodis Sato. Arshdeep Bolger FNP-C

## 2023-06-04 ENCOUNTER — Other Ambulatory Visit: Payer: Self-pay | Admitting: Medical Genetics

## 2023-06-04 DIAGNOSIS — Z006 Encounter for examination for normal comparison and control in clinical research program: Secondary | ICD-10-CM

## 2023-06-18 ENCOUNTER — Other Ambulatory Visit (HOSPITAL_COMMUNITY)
Admission: RE | Admit: 2023-06-18 | Discharge: 2023-06-18 | Disposition: A | Discharge status: Home / Self Care | Payer: BC Managed Care – PPO | Source: Ambulatory Visit | Attending: Oncology | Admitting: Oncology

## 2023-06-18 DIAGNOSIS — Z006 Encounter for examination for normal comparison and control in clinical research program: Secondary | ICD-10-CM | POA: Insufficient documentation

## 2023-06-21 ENCOUNTER — Other Ambulatory Visit: Payer: Self-pay | Admitting: Nurse Practitioner

## 2023-06-21 DIAGNOSIS — E669 Obesity, unspecified: Secondary | ICD-10-CM

## 2023-06-21 DIAGNOSIS — R632 Polyphagia: Secondary | ICD-10-CM

## 2023-06-29 LAB — GENECONNECT MOLECULAR SCREEN: Genetic Analysis Overall Interpretation: NEGATIVE

## 2023-07-05 ENCOUNTER — Ambulatory Visit: Payer: BC Managed Care – PPO | Admitting: Internal Medicine

## 2023-07-06 ENCOUNTER — Ambulatory Visit: Payer: BC Managed Care – PPO | Admitting: Internal Medicine

## 2023-07-07 ENCOUNTER — Encounter: Payer: Self-pay | Admitting: Internal Medicine

## 2023-07-07 ENCOUNTER — Ambulatory Visit: Payer: BC Managed Care – PPO | Admitting: Internal Medicine

## 2023-07-07 VITALS — BP 128/76 | HR 77 | Ht 66.0 in | Wt 217.4 lb

## 2023-07-07 DIAGNOSIS — E063 Autoimmune thyroiditis: Secondary | ICD-10-CM | POA: Diagnosis not present

## 2023-07-07 NOTE — Patient Instructions (Signed)
Please stop at the lab.  Please continue Levothyroxine 200 mcg daily.  Take the thyroid hormone every day, with water, at least 30 minutes before breakfast, separated by at least 4 hours from: - acid reflux medications - calcium - iron - multivitamins  Please come back for a follow-up appointment in 1 year.

## 2023-07-07 NOTE — Progress Notes (Unsigned)
Patient ID: Carolyn Harvey, female   DOB: [birth year], 47 y.o.   MRN: [id number]   HPI  Carolyn Harvey is a 47 y.o.-year-old female, returning for f/u for hypothyroidism due to Hashimoto's thyroiditis.  Last visit 1 year ago.  Interim history: She started to see Dr. Manson Passey and then Zepbound 5 mg (550 $ per 4 vials) - started in 02/2023. No GI issues. She had her GB taken out >20 years ago.  She also started to exercise-strength training 4 times a week.  She feels great.  At last visit she had some fatigue as she was working long hours due to being short staffed at work.  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 She otherwise feels well, without complaints today.  Reviewed history: She has been dx with hypothyroidism in early 1990s >> on generic levothyroxine.  Previously on Synthroid d.a.w.  She is on levothyroxine 200 mcg daily (dose increased 06/2021): - in am, with the Fluoxetine  - fasting - at least 30 min from b'fast - no calcium - no iron - + multivitamins at night - no PPIs - not on Biotin  Reviewed her TFTs: Lab Results  Component Value Date   TSH 0.704 12/31/2022   TSH 0.60 07/02/2022   TSH 1.67 08/15/2021   TSH 9.93 (H) 07/02/2021   TSH 3.67 07/01/2020   TSH 3.67 11/21/2019   TSH 0.08 (L) 06/20/2019   TSH 0.93 06/17/2018   TSH 7.05 (H) 09/29/2017   TSH 4.63 (H) 06/29/2017   FREET4 1.52 12/31/2022   FREET4 1.21 07/02/2022   FREET4 1.43 08/15/2021   FREET4 0.95 07/02/2021   FREET4 1.02 07/01/2020   FREET4 1.23 11/21/2019   FREET4 1.37 06/20/2019   FREET4 1.23 06/17/2018   FREET4 1.03 09/29/2017   FREET4 1.34 06/29/2017    Her TPO antibodies were positive in the past: Component     Latest Ref Rng & Units 09/29/2017  Thyroglobulin Ab     < or = 1 IU/mL 190 (H)  Thyroperoxidase Ab SerPl-aCnc     <9 IU/mL 6   Pt denies: - feeling nodules in neck - hoarseness - dysphagia - choking  She has positive family history of  thyroid disorders: M - hyperthyroidism -  Had to have RAI tx; 2 sisters. + sister with RA.  + FH of thyroid cancer in first cousin. No h/o radiation tx to head or neck. No herbal supplements. No Biotin use. No recent steroids use.    She has a history of HTN.  On losartan. She is on fluoxetine.  Mother had colon cancer >> passed away at the beginning of 2024.  ROS: + see HPI  I reviewed pt's medications, allergies, PMH, social hx, family hx, and changes were documented in the history of present illness. Otherwise, unchanged from my initial visit note.   Past Medical History:  Diagnosis Date   Allergy    Anxiety    Back pain    Back pain    Depression    Gallbladder problem    Health care maintenance 12/31/2022   Hypertension    07/23/20 lost 100# and no longer needs meds x 6 months   Swelling of lower extremity    Thyroid disease    Vitamin D deficiency    Past Surgical History:  Procedure Laterality Date   CHOLECYSTECTOMY     TONSILLECTOMY     WISDOM TOOTH EXTRACTION     Social History   Socioeconomic  History   Marital status: Single    Spouse name: Not on file   Number of children: 0  Occupational History   librarian  Tobacco Use   Smoking status: Never Smoker   Smokeless tobacco: Never Used  Substance and Sexual Activity   Alcohol use: Yes, beer, 1-2x a week   Drug use: No   Sexual activity: Yes  Other Topics Concern   Not on file  Social History Narrative   Nurse, learning disability   Single with same partner   No children, 2 dogs   Current Outpatient Medications on File Prior to Visit  Medication Sig Dispense Refill   Bacillus Coagulans-Inulin (ALIGN PREBIOTIC-PROBIOTIC PO) Take by mouth.     cetirizine (ZYRTEC) 10 MG tablet Take 1 tablet by mouth daily.     Collagenase POWD 1 Scoop by Does not apply route daily in the afternoon.     FLUoxetine (PROZAC) 10 MG capsule TAKE 1 CAPSULE BY MOUTH DAILY 90 capsule 3   FLUoxetine (PROZAC) 20 MG capsule  TAKE 1 CAPSULE BY MOUTH DAILY 90 capsule 3   fluticasone (FLONASE) 50 MCG/ACT nasal spray Place 2 sprays daily into both nostrils. 16 g 6   levonorgestrel (MIRENA) 20 MCG/24HR IUD 1 each by Intrauterine route once.     levothyroxine (SYNTHROID) 200 MCG tablet TAKE 1 TABLET BY MOUTH DAILY 90 tablet 2   Multiple Vitamin (MULTIVITAMIN) tablet Take 1 tablet by mouth daily.     tirzepatide 5 MG/0.5ML injection vial Inject 5 mg into the skin once a week. 6 mL 0   No current facility-administered medications on file prior to visit.   No Known Allergies Family History  Problem Relation Age of Onset   Asthma Mother    Miscarriages / Stillbirths Mother    Hypothyroidism Mother    Colon cancer Mother 21       mets to brain   Colon polyps Mother    Cancer Mother    Obesity Mother    High blood pressure Mother    Diabetes Father    Hypertension Father    Pulmonary embolism Father    Obesity Father    Miscarriages / [place] Sister    Fibromyalgia Sister    Hypothyroidism Sister    Arthritis Sister    Obesity Sister    Stroke Maternal Grandmother    CVA Maternal Grandfather    Alzheimer's disease Paternal Grandmother    Stroke Paternal Grandfather    Stomach cancer Paternal Uncle    Breast cancer Neg Hx    Esophageal cancer Neg Hx    Rectal cancer Neg Hx    PE: BP 128/76   Pulse 77   Ht 5\' 6"  (1.676 m)   Wt 217 lb 6.4 oz (98.6 kg)   SpO2 97%   BMI 35.09 kg/m  Wt Readings from Last 10 Encounters:  07/07/23 217 lb 6.4 oz (98.6 kg)  05/31/23 216 lb (98 kg)  05/04/23 220 lb (99.8 kg)  04/16/23 226 lb 4 oz (102.6 kg)  03/04/23 230 lb (104.3 kg)  02/11/23 242 lb (109.8 kg)  02/01/23 239 lb (108.4 kg)  01/18/23 240 lb (108.9 kg)  12/31/22 243 lb (110.2 kg)  12/10/22 242 lb (109.8 kg)   Constitutional: overweight, in NAD Eyes:  EOMI, no exophthalmos ENT: no neck masses, no cervical lymphadenopathy Cardiovascular: RRR, No MRG, + LLE edema Respiratory: CTA  B Musculoskeletal: no deformities Skin:no rashes Neurological: no tremor with outstretched hands  ASSESSMENT: 1. Hypothyroidism due to  Hashimoto's thyroiditis  PLAN:  1. 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  However, by our visit in 2022, she gained 47-50 pounds in the previous year and we had to increase the dose to 200 mcg daily.  Before our visit from 2023, she only gained 4 pounds back. - latest thyroid labs reviewed with pt. >> normal: Lab Results  Component Value Date   TSH 0.704 12/31/2022  - she continues on LT4 200 mcg daily - pt feels good on this dose.  Since last visit, she started to go to the weight management clinic.  She started Zepbound in 02/2023.  She lost a net 13 pounds, however, she initially gained 12 pounds and then lost 25 in the last 5 months.  We discussed that weight loss can impact her LT4 requirement.  Even if the labs are normal now, I recommended to have another TSH checked in approximately 6 months from now, halfway between appointments. - we discussed about taking the thyroid hormone every day, with water, >30 minutes before breakfast, separated by >4 hours from acid reflux medications, calcium, iron, multivitamins. Pt. is taking it correctly. - will check thyroid tests today: TSH and fT4 - If labs are abnormal, she will need to return for repeat TFTs in 1.5 months - I we will see her back in a year  Needs a refill of LT4.  Carlus Pavlov, MD PhD [facility] Endocrinology

## 2023-07-08 LAB — T4, FREE: Free T4: 1.3 ng/dL (ref 0.8–1.8)

## 2023-07-08 LAB — TSH: TSH: 8.51 m[IU]/L — ABNORMAL HIGH

## 2023-07-08 MED ORDER — LEVOTHYROXINE SODIUM 200 MCG PO TABS: 200.0000 ug | ORAL_TABLET | Freq: Every day | ORAL | 3 refills | Status: DC

## 2023-07-09 ENCOUNTER — Other Ambulatory Visit: Payer: Self-pay

## 2023-07-12 ENCOUNTER — Telehealth: Payer: Self-pay

## 2023-07-12 NOTE — Telephone Encounter (Signed)
Received a call from [place] at [facility] she states that the manufacturer has changed for this patients Levothyroxine. Ok for Change?

## 2023-07-13 NOTE — Telephone Encounter (Signed)
Carolyn Harvey has been notified.

## 2023-07-19 ENCOUNTER — Encounter: Payer: Self-pay | Admitting: Nurse Practitioner

## 2023-07-19 ENCOUNTER — Ambulatory Visit: Payer: BC Managed Care – PPO | Admitting: Nurse Practitioner

## 2023-07-19 VITALS — BP 114/79 | HR 73 | Temp 98.0°F | Ht 66.0 in | Wt 208.0 lb

## 2023-07-19 DIAGNOSIS — Z6833 Body mass index (BMI) 33.0-33.9, adult: Secondary | ICD-10-CM

## 2023-07-19 DIAGNOSIS — E063 Autoimmune thyroiditis: Secondary | ICD-10-CM | POA: Diagnosis not present

## 2023-07-19 DIAGNOSIS — E669 Obesity, unspecified: Secondary | ICD-10-CM

## 2023-07-19 DIAGNOSIS — R632 Polyphagia: Secondary | ICD-10-CM

## 2023-07-19 MED ORDER — TIRZEPATIDE-WEIGHT MANAGEMENT 5 MG/0.5ML ~~LOC~~ SOLN: 5.0000 mg | SUBCUTANEOUS | 0 refills | Status: DC

## 2023-07-19 NOTE — Progress Notes (Signed)
Office: [phone]  /  Fax: [phone]  WEIGHT SUMMARY AND BIOMETRICS  Weight Lost Since Last Visit: 8lb  Weight Gained Since Last Visit: 0lb   Vitals Temp: 98 F (36.7 C) BP: 114/79 Pulse Rate: 73 SpO2: 99 %   Anthropometric Measurements Height: 5\' 6"  (1.676 m) Weight: 208 lb (94.3 kg) BMI (Calculated): 33.59 Weight at Last Visit: 216lb Weight Lost Since Last Visit: 8lb Weight Gained Since Last Visit: 0lb Starting Weight: 243lb Total Weight Loss (lbs): 35 lb (15.9 kg)   Body Composition  Body Fat %: 39.1 % Fat Mass (lbs): 81.4 lbs Muscle Mass (lbs): 120.4 lbs Total Body Water (lbs): 85.8 lbs Visceral Fat Rating : 9   Other Clinical Data Fasting: No Labs: No Today's Visit #: 9 Starting Date: 12/31/22     HPI  Chief Complaint: OBESITY  Carolyn Harvey is here to discuss her progress with her obesity treatment plan. She is on the the Category 3 Plan and states she is following her eating plan approximately 70 % of the time. She states she is exercising 45 minutes 5-6 days per week.   Interval History:  Since last office visit she has lost 8 pounds. She is aiming to eat protein with each meal.  She is averaging around 120 grams of protein.  She is drinking water and coffee daily and occ beer.  She hasn't been able to exercise as she was due to left shoulder pain over the past 10 days. Her cousin is a PT and has been helping her with exercises and is icing it twice daily.      Pharmacotherapy for weight loss: She is currently taking Zepbound 5mg  for medical weight loss.  Denies side effects.     Previous pharmacotherapy for medical weight loss:  None   Bariatric surgery:  Patient has not had bariatric surgery.     Hypothyroidism Stable.  Does not report symptoms associated with uncontrolled hypothyroidism. She saw Endo last on 07/07/23.  Will have labs rechecked in January.  Medication(s): Levothyroxine 200 mcg daily. Lab Results  Component Value Date   TSH  8.51 (H) 07/07/2023     PHYSICAL EXAM:  Blood pressure 114/79, pulse 73, temperature 98 F (36.7 C), height 5\' 6"  (1.676 m), weight 208 lb (94.3 kg), SpO2 99%. Body mass index is 33.57 kg/m.  General: She is overweight, cooperative, alert, well developed, and in no acute distress. PSYCH: Has normal mood, affect and thought process.   Extremities: No edema.  Neurologic: No gross sensory or motor deficits. No tremors or fasciculations noted.    DIAGNOSTIC DATA REVIEWED:  BMET    Component Value Date/Time   NA 141 12/31/2022 1129   K 5.1 01/18/2023 0932   CL 101 12/31/2022 1129   CO2 24 12/31/2022 1129   GLUCOSE 94 12/31/2022 1129   GLUCOSE 79 04/13/2022 0937   BUN 14 12/31/2022 1129   CREATININE 0.89 12/31/2022 1129   CALCIUM 9.5 12/31/2022 1129   Lab Results  Component Value Date   HGBA1C 5.4 12/31/2022   HGBA1C 5.5 06/29/2017   Lab Results  Component Value Date   INSULIN 7.5 12/31/2022   Lab Results  Component Value Date   TSH 8.51 (H) 07/07/2023   CBC    Component Value Date/Time   WBC 4.6 04/13/2022 0937   RBC 4.17 04/13/2022 0937   HGB 13.1 04/13/2022 0937   HCT 39.1 04/13/2022 0937   PLT 262.0 04/13/2022 0937   MCV 93.8 04/13/2022 0937   MCHC  33.4 04/13/2022 0937   RDW 12.9 04/13/2022 0937   Iron Studies No results found for: "IRON", "TIBC", "FERRITIN", "IRONPCTSAT" Lipid Panel     Component Value Date/Time   CHOL 174 12/31/2022 1129   TRIG 84 12/31/2022 1129   HDL 51 12/31/2022 1129   CHOLHDL 3 04/13/2022 0937   VLDL 15.0 04/13/2022 0937   LDLCALC 107 (H) 12/31/2022 1129   Hepatic Function Panel     Component Value Date/Time   PROT 6.6 12/31/2022 1129   ALBUMIN 4.1 12/31/2022 1129   AST 20 12/31/2022 1129   ALT 17 12/31/2022 1129   ALKPHOS 78 12/31/2022 1129   BILITOT 0.6 12/31/2022 1129      Component Value Date/Time   TSH 8.51 (H) 07/07/2023 1018   Nutritional Lab Results  Component Value Date   VD25OH 62.5 12/31/2022      ASSESSMENT AND PLAN  TREATMENT PLAN FOR OBESITY:  Recommended Dietary Goals  Gracelee is currently in the action stage of change. As such, her goal is to continue weight management plan. She has agreed to the Category 3 Plan.  Behavioral Intervention  We discussed the following Behavioral Modification Strategies today: celebration eating strategies and continue to work on maintaining a reduced calorie state, getting the recommended amount of protein, incorporating whole foods, making healthy choices, staying well hydrated and practicing mindfulness when eating..  Additional resources provided today: NA  Recommended Physical Activity Goals  Blen has been advised to work up to 150 minutes of moderate intensity aerobic activity a week and strengthening exercises 2-3 times per week for cardiovascular health, weight loss maintenance and preservation of muscle mass.   She has agreed to Continue current level of physical activity    Pharmacotherapy We discussed various medication options to help Danea with her weight loss efforts and we both agreed to continue Zepbound 5mg .  Side effects discussed.  ASSOCIATED CONDITIONS ADDRESSED TODAY  Action/Plan  Hypothyroidism due to Hashimoto's thyroiditis Continue to follow up with Endo.  Continue meds as directed  Polyphagia Doing well with Zepbound 5mg .    Generalized obesity -     Continue Tirzepatide-Weight Management; Inject 5 mg into the skin once a week.  Dispense: 6 mL; Refill: 0. Side effects discussed.   BMI 33.0-33.9,adult         Return in about 7 weeks (around 09/06/2023).Marland Kitchen She was informed of the importance of frequent follow up visits to maximize her success with intensive lifestyle modifications for her multiple health conditions.   ATTESTASTION STATEMENTS:  Reviewed by clinician on day of visit: allergies, medications, problem list, medical history, surgical history, family history, social history, and previous  encounter notes.   Theodis Sato. Espyn Radwan FNP-C

## 2023-08-10 ENCOUNTER — Other Ambulatory Visit: Payer: 59

## 2023-08-11 ENCOUNTER — Encounter: Payer: Self-pay | Admitting: Internal Medicine

## 2023-08-11 ENCOUNTER — Other Ambulatory Visit: Payer: Self-pay | Admitting: Internal Medicine

## 2023-08-11 ENCOUNTER — Encounter: Payer: Self-pay | Admitting: Nurse Practitioner

## 2023-08-11 DIAGNOSIS — E063 Autoimmune thyroiditis: Secondary | ICD-10-CM

## 2023-08-11 LAB — T4, FREE: Free T4: 1.3 ng/dL (ref 0.8–1.8)

## 2023-08-11 LAB — TSH: TSH: 7.06 m[IU]/L — ABNORMAL HIGH

## 2023-09-06 ENCOUNTER — Ambulatory Visit: Payer: 59 | Admitting: Nurse Practitioner

## 2023-09-06 ENCOUNTER — Encounter: Payer: Self-pay | Admitting: Nurse Practitioner

## 2023-09-06 VITALS — BP 124/72 | HR 53 | Temp 97.9°F | Ht 66.0 in | Wt 201.0 lb

## 2023-09-06 DIAGNOSIS — E063 Autoimmune thyroiditis: Secondary | ICD-10-CM | POA: Diagnosis not present

## 2023-09-06 DIAGNOSIS — E669 Obesity, unspecified: Secondary | ICD-10-CM

## 2023-09-06 DIAGNOSIS — R632 Polyphagia: Secondary | ICD-10-CM

## 2023-09-06 DIAGNOSIS — Z6832 Body mass index (BMI) 32.0-32.9, adult: Secondary | ICD-10-CM

## 2023-09-06 MED ORDER — TIRZEPATIDE-WEIGHT MANAGEMENT 5 MG/0.5ML ~~LOC~~ SOLN: 5.0000 mg | SUBCUTANEOUS | 0 refills | Status: DC

## 2023-09-06 NOTE — Progress Notes (Signed)
Office: [phone]  /  Fax: [phone]  WEIGHT SUMMARY AND BIOMETRICS  Weight Lost Since Last Visit: 7lb  Weight Gained Since Last Visit: 0lb   Vitals Temp: 97.9 F (36.6 C) BP: 124/72 Pulse Rate: (!) 53 SpO2: 100 %   Anthropometric Measurements Height: 5\' 6"  (1.676 m) Weight: 201 lb (91.2 kg) BMI (Calculated): 32.46 Weight at Last Visit: 208lb Weight Lost Since Last Visit: 7lb Weight Gained Since Last Visit: 0lb Starting Weight: 243lb Total Weight Loss (lbs): 42 lb (19.1 kg)   Body Composition  Body Fat %: 38.7 % Fat Mass (lbs): 77.8 lbs Muscle Mass (lbs): 117 lbs Total Body Water (lbs): 85.2 lbs Visceral Fat Rating : 9   Other Clinical Data Fasting: No Labs: No Today's Visit #: 10 Starting Date: 12/31/22     HPI  Chief Complaint: OBESITY  Carolyn Harvey is here to discuss her progress with her obesity treatment plan. She is on the the Category 3 Plan and states she is following her eating plan approximately 70 % of the time. She states she is exercising 45 minutes 6 days per week.   Interval History:  Since last office visit she has lost 7 pounds.  She is making healthier choices and watching her portion sizes.  She denies stress eating.  She is not skipping meals. She is drinking water, coffee and occ beer.    She is going to [place] in March.      Pharmacotherapy for weight loss: She is currently taking Zepbound 5mg  for medical weight loss.  Denies side effects.  Helps with polyphagia and cravings.  She tried to take every 10 days but noticed an increase in polyphagia and cravings.     Previous pharmacotherapy for medical weight loss:  None   Bariatric surgery:  Patient has not had bariatric surgery.      Hypothyroidism Does not report symptoms associated with uncontrolled hypothyroidism.  Had TSH rechecked on 08/10/23.  To have rechecked in 5 weeks after last labs.    Medication(s): Levothyroxine 200 mcg daily.  Lab Results  Component Value Date    TSH 7.06 (H) 08/10/2023     PHYSICAL EXAM:  Blood pressure 124/72, pulse (!) 53, temperature 97.9 F (36.6 C), height 5\' 6"  (1.676 m), weight 201 lb (91.2 kg), SpO2 100%. Body mass index is 32.44 kg/m.  General: She is overweight, cooperative, alert, well developed, and in no acute distress. PSYCH: Has normal mood, affect and thought process.   Extremities: No edema.  Neurologic: No gross sensory or motor deficits. No tremors or fasciculations noted.    DIAGNOSTIC DATA REVIEWED:  BMET    Component Value Date/Time   NA 141 12/31/2022 1129   K 5.1 01/18/2023 0932   CL 101 12/31/2022 1129   CO2 24 12/31/2022 1129   GLUCOSE 94 12/31/2022 1129   GLUCOSE 79 04/13/2022 0937   BUN 14 12/31/2022 1129   CREATININE 0.89 12/31/2022 1129   CALCIUM 9.5 12/31/2022 1129   Lab Results  Component Value Date   HGBA1C 5.4 12/31/2022   HGBA1C 5.5 06/29/2017   Lab Results  Component Value Date   INSULIN 7.5 12/31/2022   Lab Results  Component Value Date   TSH 7.06 (H) 08/10/2023   CBC    Component Value Date/Time   WBC 4.6 04/13/2022 0937   RBC 4.17 04/13/2022 0937   HGB 13.1 04/13/2022 0937   HCT 39.1 04/13/2022 0937   PLT 262.0 04/13/2022 0937   MCV 93.8 04/13/2022 0937  MCHC 33.4 04/13/2022 0937   RDW 12.9 04/13/2022 0937   Iron Studies No results found for: "IRON", "TIBC", "FERRITIN", "IRONPCTSAT" Lipid Panel     Component Value Date/Time   CHOL 174 12/31/2022 1129   TRIG 84 12/31/2022 1129   HDL 51 12/31/2022 1129   CHOLHDL 3 04/13/2022 0937   VLDL 15.0 04/13/2022 0937   LDLCALC 107 (H) 12/31/2022 1129   Hepatic Function Panel     Component Value Date/Time   PROT 6.6 12/31/2022 1129   ALBUMIN 4.1 12/31/2022 1129   AST 20 12/31/2022 1129   ALT 17 12/31/2022 1129   ALKPHOS 78 12/31/2022 1129   BILITOT 0.6 12/31/2022 1129      Component Value Date/Time   TSH 7.06 (H) 08/10/2023 1456   Nutritional Lab Results  Component Value Date   VD25OH 62.5  12/31/2022     ASSESSMENT AND PLAN  TREATMENT PLAN FOR OBESITY:  Recommended Dietary Goals  Carolyn Harvey is currently in the action stage of change. As such, her goal is to continue weight Harvey plan. She has agreed to the Category 3 Plan.  Behavioral Intervention  We discussed the following Behavioral Modification Strategies today: increasing lean protein intake to established goals, decreasing simple carbohydrates , increasing vegetables, increasing fiber rich foods, increasing water intake , work on meal planning and preparation, reading food labels , keeping healthy foods at home, continue to practice mindfulness when eating, and continue to work on maintaining a reduced calorie state, getting the recommended amount of protein, incorporating whole foods, making healthy choices, staying well hydrated and practicing mindfulness when eating..  Additional resources provided today: NA  Recommended Physical Activity Goals  Carolyn Harvey has been advised to work up to 150 minutes of moderate intensity aerobic activity a week and strengthening exercises 2-3 times per week for cardiovascular health, weight loss maintenance and preservation of muscle mass.   She has agreed to Continue current level of physical activity    Pharmacotherapy We discussed various medication options to help Carolyn Harvey with her weight loss efforts and we both agreed to continue Zepbound 5mg . Side effects discussed.    ASSOCIATED CONDITIONS ADDRESSED TODAY  Action/Plan  Hypothyroidism due to Hashimoto's thyroiditis Continue to follow up with Endo.    Polyphagia Continue Zepbound 5mg .  Side effects discussed.    Generalized obesity -     Carolyn Harvey; Inject 5 mg into the skin once a week.  Dispense: 2 mL; Refill: 0  BMI 32.0-32.9,adult         Return in about 6 weeks (around 10/18/2023).Marland Kitchen She was informed of the importance of frequent follow up visits to maximize her success with intensive lifestyle  modifications for her multiple health conditions.   ATTESTASTION STATEMENTS:  Reviewed by clinician on day of visit: allergies, medications, problem list, medical history, surgical history, family history, social history, and previous encounter notes.      Theodis Sato. Lakiesha Ralphs FNP-C

## 2023-09-29 ENCOUNTER — Other Ambulatory Visit: Payer: 59

## 2023-09-29 LAB — TSH: TSH: 6.68 m[IU]/L — ABNORMAL HIGH

## 2023-09-29 LAB — T4, FREE: Free T4: 1.4 ng/dL (ref 0.8–1.8)

## 2023-09-30 ENCOUNTER — Encounter: Payer: Self-pay | Admitting: Internal Medicine

## 2023-09-30 ENCOUNTER — Other Ambulatory Visit: Payer: Self-pay | Admitting: Internal Medicine

## 2023-09-30 DIAGNOSIS — E063 Autoimmune thyroiditis: Secondary | ICD-10-CM

## 2023-09-30 MED ORDER — LEVOTHYROXINE SODIUM 25 MCG PO TABS: 25.0000 ug | ORAL_TABLET | Freq: Every day | ORAL | 3 refills | Status: DC

## 2023-10-14 ENCOUNTER — Other Ambulatory Visit: Payer: Self-pay | Admitting: Nurse Practitioner

## 2023-10-14 DIAGNOSIS — E669 Obesity, unspecified: Secondary | ICD-10-CM

## 2023-11-01 ENCOUNTER — Ambulatory Visit: Payer: BC Managed Care – PPO | Admitting: Nurse Practitioner

## 2023-11-01 ENCOUNTER — Encounter: Payer: Self-pay | Admitting: Nurse Practitioner

## 2023-11-01 VITALS — BP 126/84 | HR 70 | Temp 98.1°F | Ht 66.0 in | Wt 201.0 lb

## 2023-11-01 DIAGNOSIS — R632 Polyphagia: Secondary | ICD-10-CM

## 2023-11-01 DIAGNOSIS — E063 Autoimmune thyroiditis: Secondary | ICD-10-CM | POA: Diagnosis not present

## 2023-11-01 DIAGNOSIS — E66811 Obesity, class 1: Secondary | ICD-10-CM

## 2023-11-01 DIAGNOSIS — Z6832 Body mass index (BMI) 32.0-32.9, adult: Secondary | ICD-10-CM

## 2023-11-01 MED ORDER — TIRZEPATIDE-WEIGHT MANAGEMENT 7.5 MG/0.5ML ~~LOC~~ SOLN: 7.5000 mg | SUBCUTANEOUS | 2 refills | Status: DC

## 2023-11-01 NOTE — Progress Notes (Signed)
 Office: [phone]  /  Fax: [phone]  WEIGHT SUMMARY AND BIOMETRICS  Weight Lost Since Last Visit: 0lb  Weight Gained Since Last Visit: 0lb   Vitals Temp: 98.1 F (36.7 C) BP: 126/84 Pulse Rate: 70 SpO2: 98 %   Anthropometric Measurements Height: 5\' 6"  (1.676 m) Weight: 201 lb (91.2 kg) BMI (Calculated): 32.46 Weight at Last Visit: 201lb Weight Lost Since Last Visit: 0lb Weight Gained Since Last Visit: 0lb Starting Weight: 243lb Total Weight Loss (lbs): 42 lb (19.1 kg)   Body Composition  Body Fat %: 39.4 % Fat Mass (lbs): 79.4 lbs Muscle Mass (lbs): 115.8 lbs Total Body Water (lbs): 90.2 lbs Visceral Fat Rating : 9   Other Clinical Data Fasting: No Labs: No Today's Visit #: 11 Starting Date: 12/31/22     HPI  Chief Complaint: OBESITY  Carolyn Harvey is here to discuss her progress with her obesity treatment plan. She is on the the Category 3 Plan and states she is following her eating plan approximately 60 % of the time. She states she is exercising 35-40 minutes 5 days per week.   Interval History:  Since last office visit she has maintained her weight.  She went to [place] since her last visit. She is struggling with polyphagia and cravings-has gotten worse since her last visit.    Pharmacotherapy for weight loss: She is currently taking Zepbound 5mg .  Denies side effects.     Previous pharmacotherapy for medical weight loss:  None   Bariatric surgery:  Patient has not had bariatric surgery.     Hypothyroidism Stable.  Does not report symptoms associated with uncontrolled hypothyroidism. Medication(s): Levothyroxine 225 mcg daily.  Due to have labs rechecked in the next 1-2 weeks.   Lab Results  Component Value Date   TSH 6.68 (H) 09/29/2023      PHYSICAL EXAM:  Blood pressure 126/84, pulse 70, temperature 98.1 F (36.7 C), height 5\' 6"  (1.676 m), weight 201 lb (91.2 kg), SpO2 98%. Body mass index is 32.44 kg/m.  General: She is  overweight, cooperative, alert, well developed, and in no acute distress. PSYCH: Has normal mood, affect and thought process.   Extremities: No edema.  Neurologic: No gross sensory or motor deficits. No tremors or fasciculations noted.    DIAGNOSTIC DATA REVIEWED:  BMET    Component Value Date/Time   NA 141 12/31/2022 1129   K 5.1 01/18/2023 0932   CL 101 12/31/2022 1129   CO2 24 12/31/2022 1129   GLUCOSE 94 12/31/2022 1129   GLUCOSE 79 04/13/2022 0937   BUN 14 12/31/2022 1129   CREATININE 0.89 12/31/2022 1129   CALCIUM 9.5 12/31/2022 1129   Lab Results  Component Value Date   HGBA1C 5.4 12/31/2022   HGBA1C 5.5 06/29/2017   Lab Results  Component Value Date   INSULIN 7.5 12/31/2022   Lab Results  Component Value Date   TSH 6.68 (H) 09/29/2023   CBC    Component Value Date/Time   WBC 4.6 04/13/2022 0937   RBC 4.17 04/13/2022 0937   HGB 13.1 04/13/2022 0937   HCT 39.1 04/13/2022 0937   PLT 262.0 04/13/2022 0937   MCV 93.8 04/13/2022 0937   MCHC 33.4 04/13/2022 0937   RDW 12.9 04/13/2022 0937   Iron Studies No results found for: "IRON", "TIBC", "FERRITIN", "IRONPCTSAT" Lipid Panel     Component Value Date/Time   CHOL 174 12/31/2022 1129   TRIG 84 12/31/2022 1129   HDL 51 12/31/2022 1129   CHOLHDL  3 04/13/2022 0937   VLDL 15.0 04/13/2022 0937   LDLCALC 107 (H) 12/31/2022 1129   Hepatic Function Panel     Component Value Date/Time   PROT 6.6 12/31/2022 1129   ALBUMIN 4.1 12/31/2022 1129   AST 20 12/31/2022 1129   ALT 17 12/31/2022 1129   ALKPHOS 78 12/31/2022 1129   BILITOT 0.6 12/31/2022 1129      Component Value Date/Time   TSH 6.68 (H) 09/29/2023 0920   Nutritional Lab Results  Component Value Date   VD25OH 62.5 12/31/2022     ASSESSMENT AND PLAN  TREATMENT PLAN FOR OBESITY:  Recommended Dietary Goals  Gisella is currently in the action stage of change. As such, her goal is to continue weight management plan. She has agreed to the  Category 3 Plan.  Behavioral Intervention  We discussed the following Behavioral Modification Strategies today: increasing lean protein intake to established goals, decreasing simple carbohydrates , increasing vegetables, increasing fiber rich foods, increasing water intake , reading food labels , keeping healthy foods at home, and continue to work on maintaining a reduced calorie state, getting the recommended amount of protein, incorporating whole foods, making healthy choices, staying well hydrated and practicing mindfulness when eating..  Additional resources provided today: NA  Recommended Physical Activity Goals  Alaysha has been advised to work up to 150 minutes of moderate intensity aerobic activity a week and strengthening exercises 2-3 times per week for cardiovascular health, weight loss maintenance and preservation of muscle mass.   She has agreed to Think about enjoyable ways to increase daily physical activity and overcoming barriers to exercise, Increase physical activity in their day and reduce sedentary time (increase NEAT)., Increase the intensity, frequency or duration of strengthening exercises , and Increase the intensity, frequency or duration of aerobic exercises     Pharmacotherapy We discussed various medication options to help Javeria with her weight loss efforts and we both agreed to increase Zepbound 7.5mg . Side effects discussed.  ASSOCIATED CONDITIONS ADDRESSED TODAY  Action/Plan  Hypothyroidism due to Hashimoto's thyroiditis Continue to follow up with endo.  Rechecking labs in 1-2 weeks.    Polyphagia -     Increase Tirzepatide-Weight Management; Inject 7.5 mg into the skin once a week.  Dispense: 2 mL; Refill: 2.  Side effects discussed.    Class 1 obesity due to excess calories without serious comorbidity with body mass index (BMI) of 32.0 to 32.9 in adult -     Tirzepatide-Weight Management; Inject 7.5 mg into the skin once a week.  Dispense: 2 mL; Refill:  2      Will recheck fasting labs in May   Return in about 8 weeks (around 12/27/2023).Marland Kitchen She was informed of the importance of frequent follow up visits to maximize her success with intensive lifestyle modifications for her multiple health conditions.   ATTESTASTION STATEMENTS:  Reviewed by clinician on day of visit: allergies, medications, problem list, medical history, surgical history, family history, social history, and previous encounter notes.      Theodis Sato. Aiana Nordquist FNP-C

## 2023-11-05 ENCOUNTER — Other Ambulatory Visit

## 2023-11-06 LAB — T4, FREE: Free T4: 1.5 ng/dL (ref 0.8–1.8)

## 2023-11-06 LAB — TSH: TSH: 2.4 m[IU]/L

## 2023-11-08 ENCOUNTER — Other Ambulatory Visit: Payer: Self-pay | Admitting: Internal Medicine

## 2023-11-08 ENCOUNTER — Encounter: Payer: Self-pay | Admitting: Internal Medicine

## 2023-11-08 DIAGNOSIS — E063 Autoimmune thyroiditis: Secondary | ICD-10-CM

## 2023-12-31 ENCOUNTER — Other Ambulatory Visit: Payer: Self-pay | Admitting: Nurse Practitioner

## 2023-12-31 DIAGNOSIS — R632 Polyphagia: Secondary | ICD-10-CM

## 2023-12-31 DIAGNOSIS — E66811 Obesity, class 1: Secondary | ICD-10-CM

## 2024-01-03 ENCOUNTER — Ambulatory Visit: Admitting: Nurse Practitioner

## 2024-01-03 ENCOUNTER — Encounter: Payer: Self-pay | Admitting: Nurse Practitioner

## 2024-01-03 VITALS — BP 122/77 | HR 62 | Temp 97.4°F | Ht 66.0 in | Wt 195.0 lb

## 2024-01-03 DIAGNOSIS — E063 Autoimmune thyroiditis: Secondary | ICD-10-CM

## 2024-01-03 DIAGNOSIS — Z6831 Body mass index (BMI) 31.0-31.9, adult: Secondary | ICD-10-CM

## 2024-01-03 DIAGNOSIS — E66811 Obesity, class 1: Secondary | ICD-10-CM

## 2024-01-03 DIAGNOSIS — R632 Polyphagia: Secondary | ICD-10-CM

## 2024-01-03 MED ORDER — TIRZEPATIDE-WEIGHT MANAGEMENT 7.5 MG/0.5ML ~~LOC~~ SOLN: 7.5000 mg | SUBCUTANEOUS | 2 refills | Status: DC

## 2024-01-03 NOTE — Progress Notes (Signed)
 Office: [phone]  /  Fax: [phone]  WEIGHT SUMMARY AND BIOMETRICS  Weight Lost Since Last Visit: 6lb  Weight Gained Since Last Visit: 0   Vitals Temp: (!) 97.4 F (36.3 C) BP: 122/77 Pulse Rate: 62 SpO2: 100 %   Anthropometric Measurements Height: 5\' 6"  (1.676 m) Weight: 195 lb (88.5 kg) BMI (Calculated): 31.49 Weight at Last Visit: 201lb Weight Lost Since Last Visit: 6lb Weight Gained Since Last Visit: 0 Starting Weight: 243lb Total Weight Loss (lbs): 48 lb (21.8 kg)   Body Composition  Body Fat %: 35 % Fat Mass (lbs): 68.2 lbs Muscle Mass (lbs): 120.4 lbs Total Body Water (lbs): 89 lbs Visceral Fat Rating : 8   Other Clinical Data Fasting: no Labs: no Today's Visit #: 12 Starting Date: 12/31/22     HPI  Chief Complaint: OBESITY  Carolyn Harvey is here to discuss her progress with her obesity treatment plan. She is on the the Category 3 Plan and states she is following her eating plan approximately 50 % of the time. She states she is exercising 20 minutes 4 days per week.   Interval History:  Since last office visit she has lost 6 pounds.  She got back last night from [facility] after being there for 10 days. She is doing well with Zepbound .   Has helped with polyphagia and cravings.  She is not snacking as she was in the past.  She is not skipping meals. She is eating a protein with each meal. She snacks on chicken, greek yogurt, cottage cheese with berries.  She is drinking water and coffee and occ unsweetened tea.    The last time she was <200 lbs was 2020.    Her highest weight was 280s.   Pharmacotherapy for weight loss: She is currently taking Zepbound  7.5mg .  Denies side effects.     Previous pharmacotherapy for medical weight loss:  None   Bariatric surgery:  Patient has not had bariatric surgery.  Hypothyroidism Stable.  Does not report symptoms associated with uncontrolled hypothyroidism. Medication(s): Levothyroxine  225 mcg daily.  Last labs  looked better.   Due to have labs rechecked in June.    Lab Results  Component Value Date   TSH 2.40 11/05/2023     PHYSICAL EXAM:  Blood pressure 122/77, pulse 62, temperature (!) 97.4 F (36.3 C), height 5\' 6"  (1.676 m), weight 195 lb (88.5 kg), SpO2 100%. Body mass index is 31.47 kg/m.  General: She is overweight, cooperative, alert, well developed, and in no acute distress. PSYCH: Has normal mood, affect and thought process.   Extremities: No edema.  Neurologic: No gross sensory or motor deficits. No tremors or fasciculations noted.    DIAGNOSTIC DATA REVIEWED:  BMET    Component Value Date/Time   NA 141 12/31/2022 1129   K 5.1 01/18/2023 0932   CL 101 12/31/2022 1129   CO2 24 12/31/2022 1129   GLUCOSE 94 12/31/2022 1129   GLUCOSE 79 04/13/2022 0937   BUN 14 12/31/2022 1129   CREATININE 0.89 12/31/2022 1129   CALCIUM 9.5 12/31/2022 1129   Lab Results  Component Value Date   HGBA1C 5.4 12/31/2022   HGBA1C 5.5 06/29/2017   Lab Results  Component Value Date   INSULIN  7.5 12/31/2022   Lab Results  Component Value Date   TSH 2.40 11/05/2023   CBC    Component Value Date/Time   WBC 4.6 04/13/2022 0937   RBC 4.17 04/13/2022 0937   HGB 13.1 04/13/2022 0937  HCT 39.1 04/13/2022 0937   PLT 262.0 04/13/2022 0937   MCV 93.8 04/13/2022 0937   MCHC 33.4 04/13/2022 0937   RDW 12.9 04/13/2022 0937   Iron Studies No results found for: "IRON", "TIBC", "FERRITIN", "IRONPCTSAT" Lipid Panel     Component Value Date/Time   CHOL 174 12/31/2022 1129   TRIG 84 12/31/2022 1129   HDL 51 12/31/2022 1129   CHOLHDL 3 04/13/2022 0937   VLDL 15.0 04/13/2022 0937   LDLCALC 107 (H) 12/31/2022 1129   Hepatic Function Panel     Component Value Date/Time   PROT 6.6 12/31/2022 1129   ALBUMIN 4.1 12/31/2022 1129   AST 20 12/31/2022 1129   ALT 17 12/31/2022 1129   ALKPHOS 78 12/31/2022 1129   BILITOT 0.6 12/31/2022 1129      Component Value Date/Time   TSH 2.40  11/05/2023 1423   Nutritional Lab Results  Component Value Date   VD25OH 62.5 12/31/2022     ASSESSMENT AND PLAN  TREATMENT PLAN FOR OBESITY:  Recommended Dietary Goals  Carolyn Harvey is currently in the action stage of change. As such, her goal is to continue weight management plan. She has agreed to the Category 3 Plan-1500-1600 and 100 grams protein.   Behavioral Intervention  We discussed the following Behavioral Modification Strategies today: increasing lean protein intake to established goals, decreasing simple carbohydrates , increasing vegetables, increasing fiber rich foods, increasing water intake , work on meal planning and preparation, reading food labels , keeping healthy foods at home, and continue to work on maintaining a reduced calorie state, getting the recommended amount of protein, incorporating whole foods, making healthy choices, staying well hydrated and practicing mindfulness when eating..  Additional resources provided today: NA  Recommended Physical Activity Goals  Carolyn Harvey has been advised to work up to 150 minutes of moderate intensity aerobic activity a week and strengthening exercises 2-3 times per week for cardiovascular health, weight loss maintenance and preservation of muscle mass.   She has agreed to Think about enjoyable ways to increase daily physical activity and overcoming barriers to exercise, Increase physical activity in their day and reduce sedentary time (increase NEAT)., and continue to gradually increase the amount and intensity of exercise routine   Pharmacotherapy We discussed various medication options to help Carolyn Harvey with her weight loss efforts and we both agreed to continue Zepbound  7.5mg . Side effects discussed.  ASSOCIATED CONDITIONS ADDRESSED TODAY  Action/Plan  Hypothyroidism due to Hashimoto's thyroiditis Continue to follow up with endo. Continue meds as directed  Polyphagia -     Continue Tirzepatide -Weight Management; Inject 7.5 mg  into the skin once a week.  Dispense: 2 mL; Refill: 2. Side effects discussed.   Class 1 obesity due to excess calories without serious comorbidity with body mass index (BMI) of 31.0 to 31.9 in adult -     Continue Tirzepatide -Weight Management; Inject 7.5 mg into the skin once a week.  Dispense: 2 mL; Refill: 2. Side effects discussed.      Bio Impedance reviewed with patient.  She lost 11 lbs of fat mass and gained 5 lbs of muscle mass.    Will obtain labs at next visit.    Return in about 5 weeks (around 02/07/2024).Aaron Aas She was informed of the importance of frequent follow up visits to maximize her success with intensive lifestyle modifications for her multiple health conditions.   ATTESTASTION STATEMENTS:  Reviewed by clinician on day of visit: allergies, medications, problem list, medical history, surgical history, family history, social  history, and previous encounter notes.     Crist Dominion. Penni Penado FNP-C

## 2024-02-10 ENCOUNTER — Ambulatory Visit: Admitting: Nurse Practitioner

## 2024-02-10 ENCOUNTER — Encounter: Payer: Self-pay | Admitting: Nurse Practitioner

## 2024-02-10 VITALS — BP 112/71 | HR 61 | Temp 98.0°F | Ht 66.0 in | Wt 188.0 lb

## 2024-02-10 DIAGNOSIS — Z6830 Body mass index (BMI) 30.0-30.9, adult: Secondary | ICD-10-CM

## 2024-02-10 DIAGNOSIS — R5383 Other fatigue: Secondary | ICD-10-CM

## 2024-02-10 DIAGNOSIS — E66811 Obesity, class 1: Secondary | ICD-10-CM

## 2024-02-10 DIAGNOSIS — E063 Autoimmune thyroiditis: Secondary | ICD-10-CM | POA: Diagnosis not present

## 2024-02-10 DIAGNOSIS — E7849 Other hyperlipidemia: Secondary | ICD-10-CM | POA: Diagnosis not present

## 2024-02-10 DIAGNOSIS — E559 Vitamin D deficiency, unspecified: Secondary | ICD-10-CM | POA: Diagnosis not present

## 2024-02-10 DIAGNOSIS — Z79899 Other long term (current) drug therapy: Secondary | ICD-10-CM

## 2024-02-10 DIAGNOSIS — R632 Polyphagia: Secondary | ICD-10-CM

## 2024-02-10 MED ORDER — TIRZEPATIDE-WEIGHT MANAGEMENT 7.5 MG/0.5ML ~~LOC~~ SOLN: 7.5000 mg | SUBCUTANEOUS | 2 refills | Status: DC

## 2024-02-10 NOTE — Progress Notes (Signed)
 Office: [phone]  /  Fax: [phone]  WEIGHT SUMMARY AND BIOMETRICS  Weight Lost Since Last Visit: 7lb  Weight Gained Since Last Visit: 0lb   Vitals Temp: 98 F (36.7 C) BP: 112/71 Pulse Rate: 61 SpO2: 98 %   Anthropometric Measurements Height: 5' 6 (1.676 m) Weight: 188 lb (85.3 kg) BMI (Calculated): 30.36 Weight at Last Visit: 195lb Weight Lost Since Last Visit: 7lb Weight Gained Since Last Visit: 0lb Starting Weight: 243lb Total Weight Loss (lbs): 55 lb (24.9 kg)   Body Composition  Body Fat %: 36.9 % Fat Mass (lbs): 69.6 lbs Muscle Mass (lbs): 113 lbs Total Body Water (lbs): 84.8 lbs Visceral Fat Rating : 8   Other Clinical Data Fasting: Yes Labs: No Today's Visit #: 13 Starting Date: 12/31/22     HPI  Chief Complaint: OBESITY  Carolyn Harvey is here to discuss her progress with her obesity treatment plan. She is on the the Category 3 Plan and states she is following her eating plan approximately 60 % of the time. She states she is exercising 30-45 minutes 6 days per week.   Interval History:  Since last office visit she has lost 7 pounds.  She is doing well with her meal plan.  She is eating a protein with each meal.  She is snacking on figs, cottage cheese, honey, cheese.  BF: 2 pieces dave's killer bread, cottage cheese, 2 eggs, tomato Lunch: hummus, josephs pita bread, bananas, apples, chicken breast and dinner:  protein and vegetable with a carb. She is drinking water with electrolytes, tea and coffee.  She is exercising 6 days per week- [place].    The last time she was <200 lbs was 2020.     Her highest weight was 280s.    Pharmacotherapy for weight loss: She is currently taking Zepbound  7.5mg .  Denies side effects.   Has helped with polyphagia and cravings.    Previous pharmacotherapy for medical weight loss:  None   Bariatric surgery:  Patient has not had bariatric surgery.   Vit D deficiency  She is taking a MVI daily   Lab Results   Component Value Date   VD25OH 62.5 12/31/2022    Hypothyroidism Stable.  Does not report symptoms associated with uncontrolled hypothyroidism. Medication(s): Levothyroxine  225 mcg daily.  Rare fatigue noted Lab Results  Component Value Date   TSH 2.40 11/05/2023   Hyperlipidemia Medication(s): none.   Lab Results  Component Value Date   CHOL 174 12/31/2022   HDL 51 12/31/2022   LDLCALC 107 (H) 12/31/2022   TRIG 84 12/31/2022   CHOLHDL 3 04/13/2022   Lab Results  Component Value Date   ALT 17 12/31/2022   AST 20 12/31/2022   ALKPHOS 78 12/31/2022   BILITOT 0.6 12/31/2022   The 10-year ASCVD risk score (Arnett DK, et al., 2019) is: 0.7%   Values used to calculate the score:     Age: 48 years     Clincally relevant sex: Female     Is Non-Hispanic African American: No     Diabetic: No     Tobacco smoker: No     Systolic Blood Pressure: 112 mmHg     Is BP treated: No     HDL Cholesterol: 51 mg/dL     Total Cholesterol: 174 mg/dL   PHYSICAL EXAM:  Blood pressure 112/71, pulse 61, temperature 98 F (36.7 C), height 5' 6 (1.676 m), weight 188 lb (85.3 kg), SpO2 98%. Body mass index is 30.34 kg/m.  General: She is overweight, cooperative, alert, well developed, and in no acute distress. PSYCH: Has normal mood, affect and thought process.   Extremities: No edema.  Neurologic: No gross sensory or motor deficits. No tremors or fasciculations noted.    DIAGNOSTIC DATA REVIEWED:  BMET    Component Value Date/Time   NA 141 12/31/2022 1129   K 5.1 01/18/2023 0932   CL 101 12/31/2022 1129   CO2 24 12/31/2022 1129   GLUCOSE 94 12/31/2022 1129   GLUCOSE 79 04/13/2022 0937   BUN 14 12/31/2022 1129   CREATININE 0.89 12/31/2022 1129   CALCIUM 9.5 12/31/2022 1129   Lab Results  Component Value Date   HGBA1C 5.4 12/31/2022   HGBA1C 5.5 06/29/2017   Lab Results  Component Value Date   INSULIN  7.5 12/31/2022   Lab Results  Component Value Date   TSH 2.40  11/05/2023   CBC    Component Value Date/Time   WBC 4.6 04/13/2022 0937   RBC 4.17 04/13/2022 0937   HGB 13.1 04/13/2022 0937   HCT 39.1 04/13/2022 0937   PLT 262.0 04/13/2022 0937   MCV 93.8 04/13/2022 0937   MCHC 33.4 04/13/2022 0937   RDW 12.9 04/13/2022 0937   Iron Studies No results found for: IRON, TIBC, FERRITIN, IRONPCTSAT Lipid Panel     Component Value Date/Time   CHOL 174 12/31/2022 1129   TRIG 84 12/31/2022 1129   HDL 51 12/31/2022 1129   CHOLHDL 3 04/13/2022 0937   VLDL 15.0 04/13/2022 0937   LDLCALC 107 (H) 12/31/2022 1129   Hepatic Function Panel     Component Value Date/Time   PROT 6.6 12/31/2022 1129   ALBUMIN 4.1 12/31/2022 1129   AST 20 12/31/2022 1129   ALT 17 12/31/2022 1129   ALKPHOS 78 12/31/2022 1129   BILITOT 0.6 12/31/2022 1129      Component Value Date/Time   TSH 2.40 11/05/2023 1423   Nutritional Lab Results  Component Value Date   VD25OH 62.5 12/31/2022     ASSESSMENT AND PLAN  TREATMENT PLAN FOR OBESITY:  Recommended Dietary Goals  Carolyn Harvey is currently in the action stage of change. As such, her goal is to continue weight management plan. She has agreed to the Category 3 Plan.  Behavioral Intervention  We discussed the following Behavioral Modification Strategies today: increasing lean protein intake to established goals, decreasing simple carbohydrates , increasing vegetables, increasing fiber rich foods, increasing water intake , reading food labels , keeping healthy foods at home, and continue to work on maintaining a reduced calorie state, getting the recommended amount of protein, incorporating whole foods, making healthy choices, staying well hydrated and practicing mindfulness when eating..  Additional resources provided today: NA  Recommended Physical Activity Goals  Carolyn Harvey has been advised to work up to 150 minutes of moderate intensity aerobic activity a week and strengthening exercises 2-3 times per week for  cardiovascular health, weight loss maintenance and preservation of muscle mass.   She has agreed to Think about enjoyable ways to increase daily physical activity and overcoming barriers to exercise, Increase physical activity in their day and reduce sedentary time (increase NEAT)., and continue to gradually increase the amount and intensity of exercise routine   Pharmacotherapy We discussed various medication options to help Carolyn Harvey with her weight loss efforts and we both agreed to continue Zepbound  7.5mg .  Side effects discussed.  ASSOCIATED CONDITIONS ADDRESSED TODAY  Action/Plan  Hypothyroidism due to Hashimoto's thyroiditis -     T4, free -  TSH  Other hyperlipidemia -     Lipid Panel With LDL/HDL Ratio  Vitamin D  deficiency -     VITAMIN D  25 Hydroxy (Vit-D Deficiency, Fractures)  Other fatigue -     CBC with Differential/Platelet  Medication management -     Comprehensive metabolic panel with GFR -     CBC with Differential/Platelet  Polyphagia -     Tirzepatide -Weight Management; Inject 7.5 mg into the skin once a week.  Dispense: 2 mL; Refill: 2  Obesity, Class I, BMI 30-34.9 -     CBC with Differential/Platelet -     Tirzepatide -Weight Management; Inject 7.5 mg into the skin once a week.  Dispense: 2 mL; Refill: 2     Patient has overall done well with weight loss and is continuing to do well on Zepbound  7.5 mg. Continue working on increasing protein intake, increasing water intake and continue to stay active.  Recommend cardio and resistance training.    Return in about 3 months (around 05/12/2024).Carolyn Harvey She was informed of the importance of frequent follow up visits to maximize her success with intensive lifestyle modifications for her multiple health conditions.   ATTESTASTION STATEMENTS:  Reviewed by clinician on day of visit: allergies, medications, problem list, medical history, surgical history, family history, social history, and previous encounter  notes.     Carolyn Harvey SAUNDERS. Fawnda Vitullo FNP-C

## 2024-02-11 LAB — T4, FREE: Free T4: 1.92 ng/dL — ABNORMAL HIGH (ref 0.82–1.77)

## 2024-02-11 LAB — LIPID PANEL WITH LDL/HDL RATIO
Cholesterol, Total: 164 mg/dL (ref 100–199)
HDL: 44 mg/dL (ref >39)
LDL Chol Calc (NIH): 108 mg/dL — ABNORMAL HIGH (ref 0–99)
LDL/HDL Ratio: 2.5 ratio (ref 0.0–3.2)
Triglycerides: 61 mg/dL (ref 0–149)
VLDL Cholesterol Cal: 12 mg/dL (ref 5–40)

## 2024-02-11 LAB — COMPREHENSIVE METABOLIC PANEL WITH GFR
ALT: 16 IU/L (ref 0–32)
AST: 23 IU/L (ref 0–40)
Albumin: 4.1 g/dL (ref 3.9–4.9)
Alkaline Phosphatase: 52 IU/L (ref 44–121)
BUN/Creatinine Ratio: 18 (ref 9–23)
BUN: 13 mg/dL (ref 6–24)
Bilirubin Total: 0.8 mg/dL (ref 0.0–1.2)
CO2: 22 mmol/L (ref 20–29)
Calcium: 9.2 mg/dL (ref 8.7–10.2)
Chloride: 102 mmol/L (ref 96–106)
Creatinine, Ser: 0.71 mg/dL (ref 0.57–1.00)
Globulin, Total: 2.1 g/dL (ref 1.5–4.5)
Glucose: 85 mg/dL (ref 70–99)
Potassium: 5.2 mmol/L (ref 3.5–5.2)
Sodium: 140 mmol/L (ref 134–144)
Total Protein: 6.2 g/dL (ref 6.0–8.5)
eGFR: 105 mL/min/1.73 (ref >59)

## 2024-02-11 LAB — CBC WITH DIFFERENTIAL/PLATELET
Basophils Absolute: 0 x10E3/uL (ref 0.0–0.2)
Basos: 0 %
EOS (ABSOLUTE): 0.1 x10E3/uL (ref 0.0–0.4)
Eos: 3 %
Hematocrit: 41.8 % (ref 34.0–46.6)
Hemoglobin: 13.3 g/dL (ref 11.1–15.9)
Immature Grans (Abs): 0 x10E3/uL (ref 0.0–0.1)
Immature Granulocytes: 0 %
Lymphocytes Absolute: 1.2 x10E3/uL (ref 0.7–3.1)
Lymphs: 25 %
MCH: 31.1 pg (ref 26.6–33.0)
MCHC: 31.8 g/dL (ref 31.5–35.7)
MCV: 98 fL — ABNORMAL HIGH (ref 79–97)
Monocytes Absolute: 0.4 x10E3/uL (ref 0.1–0.9)
Monocytes: 9 %
Neutrophils Absolute: 2.9 x10E3/uL (ref 1.4–7.0)
Neutrophils: 63 %
Platelets: 286 x10E3/uL (ref 150–450)
RBC: 4.27 x10E6/uL (ref 3.77–5.28)
RDW: 12.1 % (ref 11.7–15.4)
WBC: 4.6 x10E3/uL (ref 3.4–10.8)

## 2024-02-11 LAB — VITAMIN D 25 HYDROXY (VIT D DEFICIENCY, FRACTURES): Vit D, 25-Hydroxy: 69.6 ng/mL (ref 30.0–100.0)

## 2024-02-11 LAB — TSH: TSH: 0.819 u[IU]/mL (ref 0.450–4.500)

## 2024-02-15 ENCOUNTER — Encounter: Payer: Self-pay | Admitting: Internal Medicine

## 2024-03-06 ENCOUNTER — Encounter: Payer: Self-pay | Admitting: Nurse Practitioner

## 2024-03-13 ENCOUNTER — Other Ambulatory Visit: Payer: Self-pay | Admitting: Nurse Practitioner

## 2024-03-13 DIAGNOSIS — R632 Polyphagia: Secondary | ICD-10-CM

## 2024-03-13 DIAGNOSIS — E66811 Obesity, class 1: Secondary | ICD-10-CM

## 2024-03-13 MED ORDER — TIRZEPATIDE-WEIGHT MANAGEMENT 10 MG/0.5ML ~~LOC~~ SOLN: 10.0000 mg | SUBCUTANEOUS | 0 refills | Status: DC

## 2024-03-26 ENCOUNTER — Other Ambulatory Visit: Payer: Self-pay | Admitting: Internal Medicine

## 2024-04-04 ENCOUNTER — Other Ambulatory Visit: Payer: Self-pay | Admitting: Nurse Practitioner

## 2024-04-04 DIAGNOSIS — E66811 Obesity, class 1: Secondary | ICD-10-CM

## 2024-04-12 ENCOUNTER — Encounter: Payer: Self-pay | Admitting: Nurse Practitioner

## 2024-04-13 ENCOUNTER — Other Ambulatory Visit: Payer: Self-pay | Admitting: Nurse Practitioner

## 2024-04-13 DIAGNOSIS — E66811 Obesity, class 1: Secondary | ICD-10-CM

## 2024-04-13 MED ORDER — TIRZEPATIDE-WEIGHT MANAGEMENT 10 MG/0.5ML ~~LOC~~ SOLN: 10.0000 mg | SUBCUTANEOUS | 0 refills | Status: DC

## 2024-04-22 ENCOUNTER — Other Ambulatory Visit: Payer: Self-pay | Admitting: Physician Assistant

## 2024-04-23 ENCOUNTER — Other Ambulatory Visit: Payer: Self-pay | Admitting: Internal Medicine

## 2024-05-03 ENCOUNTER — Other Ambulatory Visit: Payer: Self-pay | Admitting: Nurse Practitioner

## 2024-05-03 DIAGNOSIS — E66811 Obesity, class 1: Secondary | ICD-10-CM

## 2024-05-07 ENCOUNTER — Other Ambulatory Visit: Payer: Self-pay | Admitting: Internal Medicine

## 2024-05-11 ENCOUNTER — Encounter: Payer: Self-pay | Admitting: Nurse Practitioner

## 2024-05-11 ENCOUNTER — Ambulatory Visit: Admitting: Nurse Practitioner

## 2024-05-11 VITALS — BP 111/76 | HR 63 | Temp 97.8°F | Ht 66.0 in | Wt 185.0 lb

## 2024-05-11 DIAGNOSIS — Z6829 Body mass index (BMI) 29.0-29.9, adult: Secondary | ICD-10-CM

## 2024-05-11 DIAGNOSIS — E669 Obesity, unspecified: Secondary | ICD-10-CM

## 2024-05-11 DIAGNOSIS — E063 Autoimmune thyroiditis: Secondary | ICD-10-CM | POA: Diagnosis not present

## 2024-05-11 DIAGNOSIS — R632 Polyphagia: Secondary | ICD-10-CM | POA: Diagnosis not present

## 2024-05-11 MED ORDER — TIRZEPATIDE-WEIGHT MANAGEMENT 10 MG/0.5ML ~~LOC~~ SOLN: 10.0000 mg | SUBCUTANEOUS | 2 refills | Status: DC

## 2024-05-11 NOTE — Progress Notes (Signed)
 Office: [phone]  /  Fax: [phone]  WEIGHT SUMMARY AND BIOMETRICS  Weight Lost Since Last Visit: 3lb  Weight Gained Since Last Visit: 0lb   Vitals Temp: 97.8 F (36.6 C) BP: 111/76 Pulse Rate: 63 SpO2: 100 %   Anthropometric Measurements Height: 5' 6 (1.676 m) Weight: 185 lb (83.9 kg) BMI (Calculated): 29.87 Weight at Last Visit: 188lb Weight Lost Since Last Visit: 3lb Weight Gained Since Last Visit: 0lb Starting Weight: 243lb Total Weight Loss (lbs): 58 lb (26.3 kg)   Body Composition  Body Fat %: 35.3 % Fat Mass (lbs): 65.4 lbs Muscle Mass (lbs): 113.6 lbs Total Body Water (lbs): 84 lbs Visceral Fat Rating : 8   Other Clinical Data Fasting: No Labs: No Today's Visit #: 14 Starting Date: 12/31/22     HPI  Chief Complaint: OBESITY  Carolyn Harvey is here to discuss her progress with her obesity treatment plan. She is on the the Category 3 Plan and states she is following her eating plan approximately 60 % of the time. She states she is exercising 30-45 minutes 5-6 days per week.   Interval History:  Since last office visit she has lost 3 pounds.  She is not currently tracking.  She is making healthier choices and aiming to eat more protein.  She notes Zepbound  has helped with cravings significantly.  She is drinking water, coffee and tea.  She is exercising 5-6 days per week.  She works out more on Friday, Sat and Sundays due to her work schedule.  Friday upper body and cycling, Saturday lower body and cycling and Sunday chest, back, core and cycling and 3 days during the week cycling and full body.    The last time she was <200 lbs was in 2020.     Her highest weight was 280s.    Pharmacotherapy for weight loss: She is currently taking Zepbound  10 mg every 8 days.  Denies side effects.   Has  continued to help with polyphagia and cravings.    Previous pharmacotherapy for medical weight loss:  None   Bariatric surgery:  Patient has not had bariatric  surgery.  Hypothyroidism Stable.  Does not report symptoms associated with uncontrolled hypothyroidism. Medication(s): Levothyroxine  225 mcg daily.  Has follow up appt with endo in December.  Last labs discussed with endo.    Lab Results  Component Value Date   TSH 0.819 02/10/2024   PHYSICAL EXAM:  Blood pressure 111/76, pulse 63, temperature 97.8 F (36.6 C), height 5' 6 (1.676 m), weight 185 lb (83.9 kg), SpO2 100%. Body mass index is 29.86 kg/m.  General: She is overweight, cooperative, alert, well developed, and in no acute distress. PSYCH: Has normal mood, affect and thought process.   Extremities: No edema.  Neurologic: No gross sensory or motor deficits. No tremors or fasciculations noted.    DIAGNOSTIC DATA REVIEWED:  BMET    Component Value Date/Time   NA 140 02/10/2024 1001   K 5.2 02/10/2024 1001   CL 102 02/10/2024 1001   CO2 22 02/10/2024 1001   GLUCOSE 85 02/10/2024 1001   GLUCOSE 79 04/13/2022 0937   BUN 13 02/10/2024 1001   CREATININE 0.71 02/10/2024 1001   CALCIUM 9.2 02/10/2024 1001   Lab Results  Component Value Date   HGBA1C 5.4 12/31/2022   HGBA1C 5.5 06/29/2017   Lab Results  Component Value Date   INSULIN  7.5 12/31/2022   Lab Results  Component Value Date   TSH 0.819 02/10/2024  CBC    Component Value Date/Time   WBC 4.6 02/10/2024 1001   WBC 4.6 04/13/2022 0937   RBC 4.27 02/10/2024 1001   RBC 4.17 04/13/2022 0937   HGB 13.3 02/10/2024 1001   HCT 41.8 02/10/2024 1001   PLT 286 02/10/2024 1001   MCV 98 (H) 02/10/2024 1001   MCH 31.1 02/10/2024 1001   MCHC 31.8 02/10/2024 1001   MCHC 33.4 04/13/2022 0937   RDW 12.1 02/10/2024 1001   Iron Studies No results found for: IRON, TIBC, FERRITIN, IRONPCTSAT Lipid Panel     Component Value Date/Time   CHOL 164 02/10/2024 1001   TRIG 61 02/10/2024 1001   HDL 44 02/10/2024 1001   CHOLHDL 3 04/13/2022 0937   VLDL 15.0 04/13/2022 0937   LDLCALC 108 (H) 02/10/2024 1001    Hepatic Function Panel     Component Value Date/Time   PROT 6.2 02/10/2024 1001   ALBUMIN 4.1 02/10/2024 1001   AST 23 02/10/2024 1001   ALT 16 02/10/2024 1001   ALKPHOS 52 02/10/2024 1001   BILITOT 0.8 02/10/2024 1001      Component Value Date/Time   TSH 0.819 02/10/2024 1001   Nutritional Lab Results  Component Value Date   VD25OH 69.6 02/10/2024   VD25OH 62.5 12/31/2022     ASSESSMENT AND PLAN  TREATMENT PLAN FOR OBESITY:  Recommended Dietary Goals  Carolyn Harvey is currently in the action stage of change. As such, her goal is to continue weight management plan. She has agreed to practicing portion control and making smarter food choices, such as increasing vegetables and decreasing simple carbohydrates.  Behavioral Intervention  We discussed the following Behavioral Modification Strategies today: increasing lean protein intake to established goals, decreasing simple carbohydrates , increasing vegetables, increasing fiber rich foods, increasing water intake , reading food labels , keeping healthy foods at home, continue to work on maintaining a reduced calorie state, getting the recommended amount of protein, incorporating whole foods, making healthy choices, staying well hydrated and practicing mindfulness when eating., and increase protein intake, fibrous foods (25 grams per day for women, 30 grams for men) and water to improve satiety and decrease hunger signals. .  Additional resources provided today: NA  Recommended Physical Activity Goals  Carolyn Harvey has been advised to work up to 150 minutes of moderate intensity aerobic activity a week and strengthening exercises 2-3 times per week for cardiovascular health, weight loss maintenance and preservation of muscle mass.   She has agreed to Think about enjoyable ways to increase daily physical activity and overcoming barriers to exercise, Increase physical activity in their day and reduce sedentary time (increase NEAT)., Continue to  gradually increase the amount and intensity of exercise routine, and Combine aerobic and strengthening exercises for efficiency and improved cardiometabolic health.   Pharmacotherapy We discussed various medication options to help Carolyn Harvey with her weight loss efforts and we both agreed to continue Zepbound  10mg . Side effects discussed.  ASSOCIATED CONDITIONS ADDRESSED TODAY  Action/Plan  Hypothyroidism due to Hashimoto's thyroiditis Keep appt with Endo.  Take meds as directed  Polyphagia -     Tirzepatide -Weight Management; Inject 10 mg into the skin once a week.  Dispense: 2 mL; Refill: 2  Generalized obesity: starting BMI 39.22 -     Tirzepatide -Weight Management; Inject 10 mg into the skin once a week.  Dispense: 2 mL; Refill: 2  BMI 29.0-29.9,adult      Labs reviewed in chart from 02/10/24   Return in about 3 months (around 08/11/2024).SABRA  She was informed of the importance of frequent follow up visits to maximize her success with intensive lifestyle modifications for her multiple health conditions.   ATTESTASTION STATEMENTS:  Reviewed by clinician on day of visit: allergies, medications, problem list, medical history, surgical history, family history, social history, and previous encounter notes.    Carolyn Harvey. Carolyn Vandermeer FNP-C

## 2024-06-11 ENCOUNTER — Other Ambulatory Visit: Payer: Self-pay | Admitting: Physician Assistant

## 2024-06-22 ENCOUNTER — Other Ambulatory Visit: Payer: Self-pay | Admitting: Internal Medicine

## 2024-06-22 NOTE — Telephone Encounter (Signed)
 Refill request complete

## 2024-07-06 ENCOUNTER — Encounter: Payer: Self-pay | Admitting: Internal Medicine

## 2024-07-06 ENCOUNTER — Ambulatory Visit: Payer: BC Managed Care – PPO | Admitting: Internal Medicine

## 2024-07-06 ENCOUNTER — Other Ambulatory Visit

## 2024-07-06 VITALS — BP 112/60 | HR 64 | Ht 66.0 in | Wt 189.4 lb

## 2024-07-06 DIAGNOSIS — E063 Autoimmune thyroiditis: Secondary | ICD-10-CM | POA: Diagnosis not present

## 2024-07-06 NOTE — Patient Instructions (Signed)
Please stop at the lab.  Please continue Levothyroxine 225 mcg daily.  Take the thyroid hormone every day, with water, at least 30 minutes before breakfast, separated by at least 4 hours from: - acid reflux medications - calcium - iron - multivitamins  Please come back for a follow-up appointment in 1 year.

## 2024-07-06 NOTE — Progress Notes (Addendum)
 Patient ID: Carolyn Harvey, female   DOB: [birth year], 48 y.o.   MRN: [id number]   HPI  Carolyn Harvey is a 48 y.o.-year-old female, returning for f/u for hypothyroidism due to Hashimoto's thyroiditis.  Last visit 1 year ago.  Interim history: Before last visit, she started Zepbound  5 mg (550 $ per 4 vials) in 02/2023, now 10 mg since 04/2024 per the weight management clinic. No GI issues. She had her GB taken out >20 years ago.  She also started to exercise-strength training 4 times a week.  Also, [employer]. Since last visit, she was able to lose almost 30 pounds and her muscle mass increased.  She also has more energy DXA size. She feels well, without complaints today.  Reviewed history: She has been dx with hypothyroidism in early 1990s >> on generic levothyroxine .  Previously on Synthroid  d.a.w.  She is on levothyroxine  225 mcg daily (dose increased 09/2023): - in am, with the Fluoxetine   - fasting - at least 30 min from b'fast - no calcium - no iron - + multivitamins at night - no PPIs - not on Biotin  Reviewed her TFTs: Lab Results  Component Value Date   TSH 0.819 02/10/2024   TSH 2.40 11/05/2023   TSH 6.68 (H) 09/29/2023   TSH 7.06 (H) 08/10/2023   TSH 8.51 (H) 07/07/2023   TSH 0.704 12/31/2022   TSH 0.60 07/02/2022   TSH 1.67 08/15/2021   TSH 9.93 (H) 07/02/2021   TSH 3.67 07/01/2020   FREET4 1.92 (H) 02/10/2024   FREET4 1.5 11/05/2023   FREET4 1.4 09/29/2023   FREET4 1.3 08/10/2023   FREET4 1.3 07/07/2023   FREET4 1.52 12/31/2022   FREET4 1.21 07/02/2022   FREET4 1.43 08/15/2021   FREET4 0.95 07/02/2021   FREET4 1.02 07/01/2020    Her TPO antibodies were positive in the past: Component     Latest Ref Rng & Units 09/29/2017  Thyroglobulin Ab     < or = 1 IU/mL 190 (H)  Thyroperoxidase Ab SerPl-aCnc     <9 IU/mL 6   Pt denies: - feeling nodules in neck - hoarseness - dysphagia - choking  She has positive family history of thyroid  disorders: M -  hyperthyroidism -  Had to have RAI tx; 2 sisters. + sister with RA.  + FH of thyroid  cancer in first cousin. No h/o radiation tx to head or neck. No herbal supplements. No Biotin use. No recent steroids use.    She has a history of HTN.  On losartan . She is on fluoxetine .  Mother had colon cancer >> passed away at the beginning of 2024.  ROS: + see HPI  I reviewed pt's medications, allergies, PMH, social hx, family hx, and changes were documented in the history of present illness. Otherwise, unchanged from my initial visit note.   Past Medical History:  Diagnosis Date   Allergy    Anxiety    Back pain    Back pain    Depression    Gallbladder problem    Health care maintenance 12/31/2022   Hypertension    07/23/20 lost 100# and no longer needs meds x 6 months   Swelling of lower extremity    Thyroid  disease    Vitamin D  deficiency    Past Surgical History:  Procedure Laterality Date   CHOLECYSTECTOMY     TONSILLECTOMY     WISDOM TOOTH EXTRACTION     Social History   Socioeconomic History   Marital status: Single  Spouse name: Not on file   Number of children: 0  Occupational History   librarian  Tobacco Use   Smoking status: Never Smoker   Smokeless tobacco: Never Used  Substance and Sexual Activity   Alcohol use: Yes, beer, 1-2x a week   Drug use: No   Sexual activity: Yes  Other Topics Concern   Not on file  Social History Narrative   Nurse, Learning Disability   Single with same partner   No children, 2 dogs   Current Outpatient Medications on File Prior to Visit  Medication Sig Dispense Refill   Bacillus Coagulans-Inulin (ALIGN PREBIOTIC-PROBIOTIC PO) Take by mouth.     cetirizine (ZYRTEC) 10 MG tablet Take 1 tablet by mouth daily.     Collagenase POWD 1 Scoop by Does not apply route daily in the afternoon.     FLUoxetine  (PROZAC ) 10 MG capsule Take 1 capsule by mouth once daily 90 capsule 0   FLUoxetine  (PROZAC ) 20 MG capsule Take 1 capsule by  mouth once daily 90 capsule 0   fluticasone  (FLONASE ) 50 MCG/ACT nasal spray Place 2 sprays daily into both nostrils. 16 g 6   levonorgestrel (MIRENA) 20 MCG/24HR IUD 1 each by Intrauterine route once.     levothyroxine  (SYNTHROID ) 200 MCG tablet Take 1 tablet (200 mcg total) by mouth daily. Taking 225 mcg daily 45 tablet 1   levothyroxine  (SYNTHROID ) 25 MCG tablet TAKE 1 TABLET BY MOUTH ONCE DAILY ALONG  WITH  200  MCG  TABLET 45 tablet 0   Multiple Vitamin (MULTIVITAMIN) tablet Take 1 tablet by mouth daily.     tirzepatide  10 MG/0.5ML injection vial Inject 10 mg into the skin once a week. 2 mL 2   No current facility-administered medications on file prior to visit.   No Known Allergies Family History  Problem Relation Age of Onset   Asthma Mother    Miscarriages / Stillbirths Mother    Hypothyroidism Mother    Colon cancer Mother 77       mets to brain   Colon polyps Mother    Cancer Mother    Obesity Mother    High blood pressure Mother    Diabetes Father    Hypertension Father    Pulmonary embolism Father    Obesity Father    Miscarriages / Stillbirths Sister    Fibromyalgia Sister    Hypothyroidism Sister    Arthritis Sister    Obesity Sister    Stroke Maternal Grandmother    CVA Maternal Grandfather    Alzheimer's disease Paternal Grandmother    Stroke Paternal Grandfather    Stomach cancer Paternal Uncle    Breast cancer Neg Hx    Esophageal cancer Neg Hx    Rectal cancer Neg Hx    PE: BP 112/60   Pulse 64   Ht 5' 6 (1.676 m)   Wt 189 lb 6.4 oz (85.9 kg)   SpO2 99%   BMI 30.57 kg/m  Wt Readings from Last 30 Encounters:  07/06/24 189 lb 6.4 oz (85.9 kg)  05/11/24 185 lb (83.9 kg)  02/10/24 188 lb (85.3 kg)  01/03/24 195 lb (88.5 kg)  11/01/23 201 lb (91.2 kg)  09/06/23 201 lb (91.2 kg)  07/19/23 208 lb (94.3 kg)  07/07/23 217 lb 6.4 oz (98.6 kg)  05/31/23 216 lb (98 kg)  05/04/23 220 lb (99.8 kg)  04/16/23 226 lb 4 oz (102.6 kg)  03/04/23 230 lb  (104.3 kg)  02/11/23 242 lb (109.8  kg)  02/01/23 239 lb (108.4 kg)  01/18/23 240 lb (108.9 kg)  12/31/22 243 lb (110.2 kg)  12/10/22 242 lb (109.8 kg)  07/02/22 230 lb 6.4 oz (104.5 kg)  04/13/22 226 lb 6.4 oz (102.7 kg)  07/02/21 226 lb 6.4 oz (102.7 kg)  04/10/21 215 lb 6.4 oz (97.7 kg)  09/23/20 175 lb (79.4 kg)  08/06/20 181 lb (82.1 kg)  07/23/20 181 lb (82.1 kg)  07/01/20 179 lb 12.8 oz (81.6 kg)  03/22/20 169 lb 6.1 oz (76.8 kg)  06/20/19 192 lb (87.1 kg)  04/20/19 204 lb (92.5 kg)  08/24/18 262 lb 6.1 oz (119 kg)  06/17/18 265 lb (120.2 kg)   Constitutional: overweight, in NAD Eyes:  EOMI, no exophthalmos ENT: no neck masses, no cervical lymphadenopathy Cardiovascular: RRR, No MRG, + mild LLE edema Respiratory: CTA B Musculoskeletal: no deformities Skin:no rashes Neurological: no tremor with outstretched hands  ASSESSMENT: 1. Hypothyroidism due to Hashimoto's thyroiditis  PLAN:  1. 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  At that time, she started working from home, cooking her meals, and also exercising-yoga.  She lost 73 pounds in 1 year in 2020.  However, by her visit in 2022, she gained back 47-50 pounds and we had to increase the dose of levothyroxine  to 200 mcg daily.  Before her visit from 2023 she only gained 4 pounds back and, since last visit, she lost more than 30 pounds.  She is currently on tirzepatide . - latest thyroid  labs reviewed with pt. >> normal: Lab Results  Component Value Date   TSH 0.819 02/10/2024  - she continues on LT4 225 mcg daily, dose increased 09/2023 - pt feels good on this dose.She lost ~28 lbs since last OV - on Tirzepatide .  We discussed that in the setting of such significant weight loss, we may need to decrease her LT4 dose. - we  discussed about taking the thyroid  hormone every day, with water, >30 minutes before breakfast, separated by >4 hours from acid reflux medications, calcium, iron, multivitamins. Pt. is taking it correctly. - will check thyroid  tests today: TSH and fT4 - If labs are abnormal, she will need to return for repeat TFTs in 1.5 months - OTW, I will see her back in a year  Needs a refills of LT4.  Component     Latest Ref Rng 07/06/2024  T4,Free(Direct)     0.8 - 1.8 ng/dL 1.5   TSH     mIU/L 4.23 (H)   TSH is elevated, which is quite unusual especially in the setting of weight loss of 28 pounds.  I would like to repeat her TFTs in 1.5 months before increasing the dose.  Lela Fendt, MD PhD [facility]

## 2024-07-07 ENCOUNTER — Ambulatory Visit: Payer: Self-pay | Admitting: Internal Medicine

## 2024-07-07 LAB — T4, FREE: Free T4: 1.5 ng/dL (ref 0.8–1.8)

## 2024-07-07 LAB — TSH: TSH: 5.76 m[IU]/L — ABNORMAL HIGH

## 2024-07-07 MED ORDER — LEVOTHYROXINE SODIUM 25 MCG PO TABS: 25.0000 ug | ORAL_TABLET | Freq: Every day | ORAL | 3 refills | Status: AC

## 2024-07-07 MED ORDER — LEVOTHYROXINE SODIUM 200 MCG PO TABS: 200.0000 ug | ORAL_TABLET | Freq: Every day | ORAL | 3 refills | Status: AC

## 2024-07-07 NOTE — Addendum Note (Signed)
 Addended by: TRIXIE FILE on: 07/07/2024 11:43 AM   Modules accepted: Orders

## 2024-07-21 ENCOUNTER — Other Ambulatory Visit: Payer: Self-pay | Admitting: Nurse Practitioner

## 2024-07-21 DIAGNOSIS — R632 Polyphagia: Secondary | ICD-10-CM

## 2024-07-21 DIAGNOSIS — E669 Obesity, unspecified: Secondary | ICD-10-CM

## 2024-08-07 ENCOUNTER — Ambulatory Visit: Admitting: Nurse Practitioner

## 2024-08-07 ENCOUNTER — Encounter: Payer: Self-pay | Admitting: Nurse Practitioner

## 2024-08-07 DIAGNOSIS — Z6830 Body mass index (BMI) 30.0-30.9, adult: Secondary | ICD-10-CM | POA: Diagnosis not present

## 2024-08-07 DIAGNOSIS — R632 Polyphagia: Secondary | ICD-10-CM

## 2024-08-07 DIAGNOSIS — E669 Obesity, unspecified: Secondary | ICD-10-CM

## 2024-08-07 MED ORDER — ZEPBOUND 12.5 MG/0.5ML ~~LOC~~ SOLN: 12.5000 mg | SUBCUTANEOUS | 3 refills | Status: AC

## 2024-08-07 MED ORDER — ZEPBOUND 12.5 MG/0.5ML ~~LOC~~ SOLN: 12.5000 mg | SUBCUTANEOUS | 0 refills | Status: DC

## 2024-08-07 NOTE — Progress Notes (Signed)
 "  Office: [phone]  /  Fax: [phone]  WEIGHT SUMMARY AND BIOMETRICS  Weight Lost Since Last Visit: 0lb  Weight Gained Since Last Visit: 1lb   Vitals BP: 115/79 Pulse Rate: 73 SpO2: 99 %   Anthropometric Measurements Height: 5' 6 (1.676 m) Weight: 186 lb (84.4 kg) BMI (Calculated): 30.04 Weight at Last Visit: 185lb Weight Lost Since Last Visit: 0lb Weight Gained Since Last Visit: 1lb Starting Weight: 243lb Total Weight Loss (lbs): 57 lb (25.9 kg)   Body Composition  Body Fat %: 35.6 % Fat Mass (lbs): 66.2 lbs Muscle Mass (lbs): 113.8 lbs Total Body Water (lbs): 84.4 lbs Visceral Fat Rating : 8   Other Clinical Data Fasting: No Labs: No Today's Visit #: 15 Starting Date: 12/31/22     HPI  Chief Complaint: OBESITY  Carolyn Harvey is here to discuss her progress with her obesity treatment plan. She is on the the Category 3 Plan and states she is following her eating plan approximately 80 % of the time. She states she is exercising 60 minutes 6 days per week.   Interval History:  Since last office visit she has gained 1 pound.  She is doing well with meeting her protein goals.  She is not tracking her calories.  She is doing resistance training and riding her bike 6 days per week. She has been increasing her weights since her last visit.    Her highest weight was 280s.    Pharmacotherapy for weight loss: She is currently taking Zepbound  10 mg weekly (self pay).  Denies side effects.   Has  continued to help with cravings. Notes an increase in polyphagia.     Previous pharmacotherapy for medical weight loss:  None   Bariatric surgery:  Patient has not had bariatric surgery.   PHYSICAL EXAM:  Blood pressure 115/79, pulse 73, height 5' 6 (1.676 m), weight 186 lb (84.4 kg), SpO2 99%. Body mass index is 30.02 kg/m.  General: She is overweight, cooperative, alert, well developed, and in no acute distress. PSYCH: Has normal mood, affect and thought process.    Extremities: No edema.  Neurologic: No gross sensory or motor deficits. No tremors or fasciculations noted.    DIAGNOSTIC DATA REVIEWED:  BMET    Component Value Date/Time   NA 140 02/10/2024 1001   K 5.2 02/10/2024 1001   CL 102 02/10/2024 1001   CO2 22 02/10/2024 1001   GLUCOSE 85 02/10/2024 1001   GLUCOSE 79 04/13/2022 0937   BUN 13 02/10/2024 1001   CREATININE 0.71 02/10/2024 1001   CALCIUM 9.2 02/10/2024 1001   Lab Results  Component Value Date   HGBA1C 5.4 12/31/2022   HGBA1C 5.5 06/29/2017   Lab Results  Component Value Date   INSULIN  7.5 12/31/2022   Lab Results  Component Value Date   TSH 5.76 (H) 07/06/2024   CBC    Component Value Date/Time   WBC 4.6 02/10/2024 1001   WBC 4.6 04/13/2022 0937   RBC 4.27 02/10/2024 1001   RBC 4.17 04/13/2022 0937   HGB 13.3 02/10/2024 1001   HCT 41.8 02/10/2024 1001   PLT 286 02/10/2024 1001   MCV 98 (H) 02/10/2024 1001   MCH 31.1 02/10/2024 1001   MCHC 31.8 02/10/2024 1001   MCHC 33.4 04/13/2022 0937   RDW 12.1 02/10/2024 1001   Iron Studies No results found for: IRON, TIBC, FERRITIN, IRONPCTSAT Lipid Panel     Component Value Date/Time   CHOL 164 02/10/2024 1001  TRIG 61 02/10/2024 1001   HDL 44 02/10/2024 1001   CHOLHDL 3 04/13/2022 0937   VLDL 15.0 04/13/2022 0937   LDLCALC 108 (H) 02/10/2024 1001   Hepatic Function Panel     Component Value Date/Time   PROT 6.2 02/10/2024 1001   ALBUMIN 4.1 02/10/2024 1001   AST 23 02/10/2024 1001   ALT 16 02/10/2024 1001   ALKPHOS 52 02/10/2024 1001   BILITOT 0.8 02/10/2024 1001      Component Value Date/Time   TSH 5.76 (H) 07/06/2024 0942   Nutritional Lab Results  Component Value Date   VD25OH 69.6 02/10/2024   VD25OH 62.5 12/31/2022     ASSESSMENT AND PLAN  TREATMENT PLAN FOR OBESITY:  Recommended Dietary Goals  Carolyn Harvey is currently in the action stage of change. As such, her goal is to continue weight management plan. She has agreed to  track and will review her calories and macros at her next visit.  Behavioral Intervention  We discussed the following Behavioral Modification Strategies today: increasing lean protein intake to established goals, decreasing simple carbohydrates , increasing vegetables, increasing fiber rich foods, increasing water intake , work on meal planning and preparation, work on tracking and journaling calories using tracking application, reading food labels , keeping healthy foods at home, planning for success, continue to work on maintaining a reduced calorie state, getting the recommended amount of protein, incorporating whole foods, making healthy choices, staying well hydrated and practicing mindfulness when eating., and increase protein intake, fibrous foods (25 grams per day for women, 30 grams for men) and water to improve satiety and decrease hunger signals. .  Additional resources provided today: NA  Recommended Physical Activity Goals  Carolyn Harvey has been advised to work up to 150 minutes of moderate intensity aerobic activity a week and strengthening exercises 2-3 times per week for cardiovascular health, weight loss maintenance and preservation of muscle mass.   She has agreed to Continue current level of physical activity , Think about enjoyable ways to increase daily physical activity and overcoming barriers to exercise, Increase physical activity in their day and reduce sedentary time (increase NEAT)., Continue to gradually increase the amount and intensity of exercise routine, Increase volume of physical activity to a goal of 240 minutes a week, and Combine aerobic and strengthening exercises for efficiency and improved cardiometabolic health.   Pharmacotherapy We discussed various medication options to help Carolyn Harvey with her weight loss efforts and we both agreed to increase Zepbound  12.5mg  due to polyphagia.  Side effects discussed.  ASSOCIATED CONDITIONS ADDRESSED  TODAY  Action/Plan  Polyphagia -     Zepbound ; Inject 12.5 mg into the skin once a week.  Dispense: 2 mL; Refill: 3  Generalized obesity: starting BMI 39.22 -     Zepbound ; Inject 12.5 mg into the skin once a week.  Dispense: 2 mL; Refill: 3         Return in about 3 months (around 11/05/2024).Carolyn Harvey She was informed of the importance of frequent follow up visits to maximize her success with intensive lifestyle modifications for her multiple health conditions.   ATTESTASTION STATEMENTS:  Reviewed by clinician on day of visit: allergies, medications, problem list, medical history, surgical history, family history, social history, and previous encounter notes.      Carolyn Harvey. Carolyn Clover FNP-C "

## 2024-08-22 ENCOUNTER — Other Ambulatory Visit

## 2024-08-22 ENCOUNTER — Other Ambulatory Visit: Payer: Self-pay | Admitting: Physician Assistant

## 2024-08-23 LAB — T4, FREE: Free T4: 1.2 ng/dL (ref 0.8–1.8)

## 2024-08-23 LAB — TSH: TSH: 17.44 m[IU]/L — ABNORMAL HIGH

## 2024-08-24 ENCOUNTER — Other Ambulatory Visit: Payer: Self-pay | Admitting: Internal Medicine

## 2024-08-24 DIAGNOSIS — E063 Autoimmune thyroiditis: Secondary | ICD-10-CM

## 2024-11-27 ENCOUNTER — Ambulatory Visit: Admitting: Nurse Practitioner

## 2025-07-06 ENCOUNTER — Ambulatory Visit: Admitting: Internal Medicine
